# Patient Record
Sex: Female | Born: 1965 | Race: White | Hispanic: No | Marital: Single
Health system: Southern US, Community
[De-identification: ages and names within clinical notes are randomized; demographics above are authoritative.]

## PROBLEM LIST (undated history)

## (undated) DIAGNOSIS — Z460 Encounter for fitting and adjustment of spectacles and contact lenses: Secondary | ICD-10-CM

## (undated) DIAGNOSIS — F419 Anxiety disorder, unspecified: Secondary | ICD-10-CM

## (undated) DIAGNOSIS — F32A Depression, unspecified: Secondary | ICD-10-CM

## (undated) DIAGNOSIS — R112 Nausea with vomiting, unspecified: Secondary | ICD-10-CM

## (undated) DIAGNOSIS — M5430 Sciatica, unspecified side: Secondary | ICD-10-CM

## (undated) DIAGNOSIS — F329 Major depressive disorder, single episode, unspecified: Secondary | ICD-10-CM

## (undated) DIAGNOSIS — Z9889 Other specified postprocedural states: Secondary | ICD-10-CM

## (undated) DIAGNOSIS — I82409 Acute embolism and thrombosis of unspecified deep veins of unspecified lower extremity: Secondary | ICD-10-CM

## (undated) DIAGNOSIS — D489 Neoplasm of uncertain behavior, unspecified: Secondary | ICD-10-CM

## (undated) DIAGNOSIS — K219 Gastro-esophageal reflux disease without esophagitis: Secondary | ICD-10-CM

## (undated) HISTORY — PX: BACK SURGERY: SHX140

## (undated) HISTORY — DX: Gastro-esophageal reflux disease without esophagitis: K21.9

---

## 2000-05-14 ENCOUNTER — Other Ambulatory Visit: Admission: RE | Admit: 2000-05-14 | Discharge: 2000-05-14 | Payer: Self-pay | Admitting: Orthopedic Surgery

## 2000-09-05 ENCOUNTER — Encounter: Admission: RE | Admit: 2000-09-05 | Discharge: 2000-09-05 | Payer: Self-pay | Admitting: Orthopedic Surgery

## 2000-09-05 ENCOUNTER — Encounter: Payer: Self-pay | Admitting: Orthopedic Surgery

## 2000-09-23 ENCOUNTER — Encounter: Admission: RE | Admit: 2000-09-23 | Discharge: 2000-11-09 | Payer: Self-pay | Admitting: Anesthesiology

## 2000-10-29 ENCOUNTER — Inpatient Hospital Stay (HOSPITAL_COMMUNITY): Admission: RE | Admit: 2000-10-29 | Discharge: 2000-10-31 | Payer: Self-pay | Admitting: Orthopedic Surgery

## 2001-01-03 ENCOUNTER — Other Ambulatory Visit: Admission: RE | Admit: 2001-01-03 | Discharge: 2001-01-03 | Payer: Self-pay | Admitting: Obstetrics and Gynecology

## 2001-01-21 ENCOUNTER — Emergency Department (HOSPITAL_COMMUNITY): Admission: EM | Admit: 2001-01-21 | Discharge: 2001-01-21 | Payer: Self-pay | Admitting: Emergency Medicine

## 2001-01-24 ENCOUNTER — Ambulatory Visit (HOSPITAL_COMMUNITY): Admission: RE | Admit: 2001-01-24 | Discharge: 2001-01-24 | Payer: Self-pay | Admitting: Orthopedic Surgery

## 2002-02-25 ENCOUNTER — Other Ambulatory Visit: Admission: RE | Admit: 2002-02-25 | Discharge: 2002-02-25 | Payer: Self-pay | Admitting: Obstetrics and Gynecology

## 2005-05-17 ENCOUNTER — Emergency Department (HOSPITAL_COMMUNITY): Admission: EM | Admit: 2005-05-17 | Discharge: 2005-05-17 | Payer: Self-pay | Admitting: Family Medicine

## 2006-03-12 HISTORY — PX: CARPAL TUNNEL RELEASE: SHX101

## 2010-03-12 DIAGNOSIS — D489 Neoplasm of uncertain behavior, unspecified: Secondary | ICD-10-CM

## 2010-03-12 DIAGNOSIS — I82409 Acute embolism and thrombosis of unspecified deep veins of unspecified lower extremity: Secondary | ICD-10-CM

## 2010-03-12 HISTORY — DX: Neoplasm of uncertain behavior, unspecified: D48.9

## 2010-03-12 HISTORY — PX: OTHER SURGICAL HISTORY: SHX169

## 2010-03-12 HISTORY — DX: Acute embolism and thrombosis of unspecified deep veins of unspecified lower extremity: I82.409

## 2010-07-18 ENCOUNTER — Other Ambulatory Visit: Payer: Self-pay | Admitting: *Deleted

## 2010-07-18 DIAGNOSIS — M545 Low back pain: Secondary | ICD-10-CM

## 2010-07-21 ENCOUNTER — Ambulatory Visit
Admission: RE | Admit: 2010-07-21 | Discharge: 2010-07-21 | Disposition: A | Discharge status: Home / Self Care | Payer: PRIVATE HEALTH INSURANCE | Source: Ambulatory Visit | Attending: *Deleted | Admitting: *Deleted

## 2010-07-21 DIAGNOSIS — M545 Low back pain: Secondary | ICD-10-CM

## 2010-07-24 ENCOUNTER — Other Ambulatory Visit: Payer: Self-pay | Admitting: Chiropractic Medicine

## 2010-07-24 DIAGNOSIS — R52 Pain, unspecified: Secondary | ICD-10-CM

## 2011-03-16 ENCOUNTER — Encounter: Payer: Self-pay | Admitting: Family Medicine

## 2011-04-05 ENCOUNTER — Encounter: Payer: Self-pay | Admitting: Family Medicine

## 2011-06-20 ENCOUNTER — Ambulatory Visit: Payer: PRIVATE HEALTH INSURANCE | Admitting: Internal Medicine

## 2011-08-01 ENCOUNTER — Ambulatory Visit (INDEPENDENT_AMBULATORY_CARE_PROVIDER_SITE_OTHER): Payer: PRIVATE HEALTH INSURANCE | Admitting: Internal Medicine

## 2011-08-01 ENCOUNTER — Encounter: Payer: Self-pay | Admitting: Internal Medicine

## 2011-08-01 VITALS — BP 140/90 | HR 82 | Temp 98.2°F | Resp 16 | Wt 152.0 lb

## 2011-08-01 DIAGNOSIS — F341 Dysthymic disorder: Secondary | ICD-10-CM

## 2011-08-01 DIAGNOSIS — F418 Other specified anxiety disorders: Secondary | ICD-10-CM

## 2011-08-01 DIAGNOSIS — J309 Allergic rhinitis, unspecified: Secondary | ICD-10-CM

## 2011-08-01 MED ORDER — AZELASTINE-FLUTICASONE 137-50 MCG/ACT NA SUSP: 1.0000 | Freq: Two times a day (BID) | NASAL | Status: DC

## 2011-08-01 MED ORDER — CLONAZEPAM 1 MG PO TABS: 1.0000 mg | ORAL_TABLET | Freq: Two times a day (BID) | ORAL | Status: DC | PRN

## 2011-08-01 MED ORDER — SERTRALINE HCL 100 MG PO TABS: 100.0000 mg | ORAL_TABLET | Freq: Every day | ORAL | Status: AC

## 2011-08-01 NOTE — Assessment & Plan Note (Signed)
She will have psychotherapy with hospice, today I asked her to increase her zoloft dose and she will take klonopin as needed for anxiety and panic

## 2011-08-01 NOTE — Patient Instructions (Signed)
Allergic Rhinitis Allergic rhinitis is when the mucous membranes in the nose respond to allergens. Allergens are particles in the air that cause your body to have an allergic reaction. This causes you to release allergic antibodies. Through a chain of events, these eventually cause you to release histamine into the blood stream (hence the use of antihistamines). Although meant to be protective to the body, it is this release that causes your discomfort, such as frequent sneezing, congestion and an itchy runny nose.  CAUSES  The pollen allergens may come from grasses, trees, and weeds. This is seasonal allergic rhinitis, or "hay fever." Other allergens cause year-round allergic rhinitis (perennial allergic rhinitis) such as house dust mite allergen, pet dander and mold spores.  SYMPTOMS   Nasal stuffiness (congestion).   Runny, itchy nose with sneezing and tearing of the eyes.   There is often an itching of the mouth, eyes and ears.  It cannot be cured, but it can be controlled with medications. DIAGNOSIS  If you are unable to determine the offending allergen, skin or blood testing may find it. TREATMENT   Avoid the allergen.   Medications and allergy shots (immunotherapy) can help.   Hay fever may often be treated with antihistamines in pill or nasal spray forms. Antihistamines block the effects of histamine. There are over-the-counter medicines that may help with nasal congestion and swelling around the eyes. Check with your caregiver before taking or giving this medicine.  If the treatment above does not work, there are many new medications your caregiver can prescribe. Stronger medications may be used if initial measures are ineffective. Desensitizing injections can be used if medications and avoidance fails. Desensitization is when a patient is given ongoing shots until the body becomes less sensitive to the allergen. Make sure you follow up with your caregiver if problems continue. SEEK  MEDICAL CARE IF:   You develop fever (more than 100.5 F (38.1 C).   You develop a cough that does not stop easily (persistent).   You have shortness of breath.   You start wheezing.   Symptoms interfere with normal daily activities.  Document Released: 11/21/2000 Document Revised: 02/15/2011 Document Reviewed: 06/02/2008 [facility] Patient Information 2012 [place], [place].Depression, Adolescent and Adult Depression is a true and treatable medical condition. In general there are two kinds of depression:  Depression we all experience in some form. For example depression from the death of a loved one, financial distress or natural disasters will trigger or increase depression.   Clinical depression, on the other hand, appears without an apparent cause or reason. This depression is a disease. Depression may be caused by chemical imbalance in the body and brain or may come as a response to a physical illness. Alcohol and other drugs can cause depression.  DIAGNOSIS  The diagnosis of depression is usually based upon symptoms and medical history. TREATMENT  Treatments for depression fall into three categories. These are:  Drug therapy. There are many medicines that treat depression. Responses may vary and sometimes trial and error is necessary to determine the best medicines and dosage for a particular patient.   Psychotherapy, also called talking treatments, helps people resolve their problems by looking at them from a different point of view and by giving people insight into their own personal makeup. Traditional psychotherapy looks at a childhood source of a problem. Other psychotherapy will look at current conflicts and move toward solving those. If the cause of depression is drug use, counseling is available to help abstain.  In time the depression will usually improve. If there were underlying causes for the chemical use, they can be addressed.   ECT (electroconvulsive therapy) or shock  treatment is not as commonly used today. It is a very effective treatment for severe suicidal depression. During ECT electrical impulses are applied to the head. These impulses cause a generalized seizure. It can be effective but causes a loss of memory for recent events. Sometimes this loss of memory may include the last several months.  Treat all depression or suicide threats as serious. Obtain professional help. Do not wait to see if serious depression will get better over time without help. Seek help for yourself or those around you. In the [place] the number to the National Suicide Help Lines With 24 Hour Help Are: 1-800-SUICIDE 629-815-2638 Document Released: 02/24/2000 Document Revised: 02/15/2011 Document Reviewed: 10/15/2007 [facility] Patient Information 2012 [place], [place].

## 2011-08-01 NOTE — Assessment & Plan Note (Signed)
Start dymista and continue claritin-d

## 2011-08-01 NOTE — Progress Notes (Signed)
Subjective:    Patient ID: Molly Fitzgerald, female    DOB: 02-22-1966, 46 y.o.   MRN: [id number]  Anxiety Presents for initial visit. Onset was 1 to 6 months ago. The problem has been gradually worsening. Symptoms include excessive worry, nervous/anxious behavior and panic. Patient reports no chest pain, compulsions, confusion, decreased concentration, depressed mood, dizziness, dry mouth, feeling of choking, hyperventilation, impotence, insomnia, irritability, malaise, muscle tension, nausea, obsessions, palpitations, restlessness, shortness of breath or suicidal ideas. Symptoms occur most days. The most recent episode lasted 3 minutes. The severity of symptoms is moderate. The symptoms are aggravated by family issues. The quality of sleep is poor. Nighttime awakenings: occasional.   Risk factors: she is caring for a dying aunt. Her past medical history is significant for depression. Past treatments include SSRIs. The treatment provided moderate relief. Compliance with prior treatments has been good.      Review of Systems  Constitutional: Negative for fever, chills, diaphoresis, activity change, appetite change, irritability, fatigue and unexpected weight change.  HENT: Positive for congestion, rhinorrhea, sneezing and postnasal drip. Negative for hearing loss, ear pain, nosebleeds, sore throat, facial swelling, drooling, mouth sores, trouble swallowing, neck pain, neck stiffness, dental problem, voice change, sinus pressure, tinnitus and ear discharge.   Eyes: Negative.   Respiratory: Negative for apnea, cough, choking, chest tightness, shortness of breath, wheezing and stridor.   Cardiovascular: Negative for chest pain, palpitations and leg swelling.  Gastrointestinal: Negative for nausea, vomiting, abdominal pain, diarrhea and constipation.  Genitourinary: Negative.  Negative for impotence.  Musculoskeletal: Negative for myalgias, back pain, joint swelling, arthralgias and gait problem.    Skin: Negative for color change, pallor, rash and wound.  Neurological: Negative.  Negative for dizziness.  Hematological: Negative for adenopathy. Does not bruise/bleed easily.  Psychiatric/Behavioral: Positive for dysphoric mood. Negative for suicidal ideas, hallucinations, behavioral problems, confusion, sleep disturbance, self-injury, decreased concentration and agitation. The patient is nervous/anxious. The patient does not have insomnia and is not hyperactive.        Objective:   Physical Exam  Vitals reviewed. Constitutional: She is oriented to person, place, and time. She appears well-developed and well-nourished. No distress.  HENT:  Head: Normocephalic and atraumatic. No trismus in the jaw.  Right Ear: Hearing, tympanic membrane, external ear and ear canal normal.  Left Ear: Hearing, tympanic membrane, external ear and ear canal normal.  Nose: Mucosal edema and rhinorrhea present. No nose lacerations, sinus tenderness, nasal deformity, septal deviation or nasal septal hematoma. No epistaxis.  No foreign bodies. Right sinus exhibits no maxillary sinus tenderness and no frontal sinus tenderness. Left sinus exhibits no maxillary sinus tenderness and no frontal sinus tenderness.  Mouth/Throat: Oropharynx is clear and moist and mucous membranes are normal. Mucous membranes are not pale, not dry and not cyanotic. No uvula swelling. No oropharyngeal exudate, posterior oropharyngeal edema, posterior oropharyngeal erythema or tonsillar abscesses.  Eyes: Conjunctivae are normal. Right eye exhibits no discharge. Left eye exhibits no discharge. No scleral icterus.  Neck: Normal range of motion. Neck supple. No JVD present. No tracheal deviation present. No thyromegaly present.  Cardiovascular: Normal rate, regular rhythm, normal heart sounds and intact distal pulses.  Exam reveals no gallop and no friction rub.   No murmur heard. Pulmonary/Chest: Effort normal and breath sounds normal. No  stridor. No respiratory distress. She has no wheezes. She has no rales. She exhibits no tenderness.  Abdominal: Soft. Bowel sounds are normal. She exhibits no distension and no mass. There is no tenderness.  There is no rebound and no guarding.  Musculoskeletal: Normal range of motion. She exhibits no edema.  Lymphadenopathy:    She has no cervical adenopathy.  Neurological: She is oriented to person, place, and time.  Skin: Skin is warm and dry. No rash noted. She is not diaphoretic. No erythema. No pallor.  Psychiatric: Her behavior is normal. Judgment and thought content normal. Her mood appears not anxious. Her affect is not angry, not blunt, not labile and not inappropriate. Her speech is not rapid and/or pressured, not delayed, not tangential and not slurred. She is not agitated, not aggressive, is not hyperactive, not slowed, not withdrawn, not actively hallucinating and not combative. Thought content is not paranoid and not delusional. Cognition and memory are normal. Cognition and memory are not impaired. She does not express impulsivity or inappropriate judgment. She exhibits a depressed mood (sad and tearful). She expresses no homicidal and no suicidal ideation. She expresses no suicidal plans and no homicidal plans. She is communicative. She exhibits normal recent memory and normal remote memory. She is attentive.          Assessment & Plan:

## 2011-10-25 ENCOUNTER — Encounter: Payer: Self-pay | Admitting: Internal Medicine

## 2011-10-25 ENCOUNTER — Other Ambulatory Visit: Payer: Self-pay | Admitting: Internal Medicine

## 2011-10-25 DIAGNOSIS — F418 Other specified anxiety disorders: Secondary | ICD-10-CM

## 2011-10-25 MED ORDER — BUPROPION HCL ER (XL) 150 MG PO TB24: 150.0000 mg | ORAL_TABLET | Freq: Every day | ORAL | Status: DC

## 2011-10-25 NOTE — Progress Notes (Signed)
  Subjective:    Patient ID: Molly Fitzgerald, female    DOB: [birth year], 46 y.o.   MRN: [id number]  HPI    Review of Systems     Objective:   Physical Exam   See note from [facility] requesting that she I write an Rx for wellbutrin     Assessment & Plan:

## 2012-02-18 ENCOUNTER — Other Ambulatory Visit: Payer: Self-pay | Admitting: Internal Medicine

## 2012-03-12 HISTORY — PX: LUMBAR DISC SURGERY: SHX700

## 2012-08-05 ENCOUNTER — Encounter (HOSPITAL_COMMUNITY): Payer: Self-pay

## 2012-08-05 ENCOUNTER — Emergency Department (HOSPITAL_COMMUNITY): Payer: No Typology Code available for payment source

## 2012-08-05 ENCOUNTER — Emergency Department (HOSPITAL_COMMUNITY)
Admission: EM | Admit: 2012-08-05 | Discharge: 2012-08-05 | Disposition: A | Discharge status: Home / Self Care | Payer: No Typology Code available for payment source | Attending: Emergency Medicine | Admitting: Emergency Medicine

## 2012-08-05 DIAGNOSIS — M549 Dorsalgia, unspecified: Secondary | ICD-10-CM | POA: Insufficient documentation

## 2012-08-05 DIAGNOSIS — Z882 Allergy status to sulfonamides status: Secondary | ICD-10-CM | POA: Insufficient documentation

## 2012-08-05 DIAGNOSIS — G8929 Other chronic pain: Secondary | ICD-10-CM | POA: Insufficient documentation

## 2012-08-05 DIAGNOSIS — K219 Gastro-esophageal reflux disease without esophagitis: Secondary | ICD-10-CM | POA: Insufficient documentation

## 2012-08-05 DIAGNOSIS — Z79899 Other long term (current) drug therapy: Secondary | ICD-10-CM | POA: Insufficient documentation

## 2012-08-05 DIAGNOSIS — Z86718 Personal history of other venous thrombosis and embolism: Secondary | ICD-10-CM | POA: Insufficient documentation

## 2012-08-05 DIAGNOSIS — H53149 Visual discomfort, unspecified: Secondary | ICD-10-CM | POA: Insufficient documentation

## 2012-08-05 DIAGNOSIS — R112 Nausea with vomiting, unspecified: Secondary | ICD-10-CM | POA: Insufficient documentation

## 2012-08-05 DIAGNOSIS — Z91040 Latex allergy status: Secondary | ICD-10-CM | POA: Insufficient documentation

## 2012-08-05 DIAGNOSIS — R51 Headache: Secondary | ICD-10-CM | POA: Insufficient documentation

## 2012-08-05 HISTORY — DX: Acute embolism and thrombosis of unspecified deep veins of unspecified lower extremity: I82.409

## 2012-08-05 HISTORY — DX: Neoplasm of uncertain behavior, unspecified: D48.9

## 2012-08-05 MED ORDER — KETOROLAC TROMETHAMINE 30 MG/ML IJ SOLN
15.0000 mg | Freq: Once | INTRAMUSCULAR | Status: AC
  Administered 2012-08-05: 15 mg via INTRAVENOUS

## 2012-08-05 MED ORDER — ONDANSETRON HCL 4 MG/2ML IJ SOLN
4.0000 mg | Freq: Once | INTRAMUSCULAR | Status: AC
  Administered 2012-08-05: 4 mg via INTRAVENOUS
  Filled 2012-08-05: qty 2

## 2012-08-05 MED ORDER — HYDROMORPHONE HCL PF 1 MG/ML IJ SOLN
1.0000 mg | Freq: Once | INTRAMUSCULAR | Status: AC
  Administered 2012-08-05: 1 mg via INTRAVENOUS
  Filled 2012-08-05: qty 1

## 2012-08-05 MED ORDER — MORPHINE SULFATE 4 MG/ML IJ SOLN
4.0000 mg | Freq: Once | INTRAMUSCULAR | Status: AC
  Administered 2012-08-05: 4 mg via INTRAVENOUS
  Filled 2012-08-05: qty 1

## 2012-08-05 MED ORDER — VALPROATE SODIUM 500 MG/5ML IV SOLN
500.0000 mg | Freq: Once | INTRAVENOUS | Status: AC
  Administered 2012-08-05: 500 mg via INTRAVENOUS
  Filled 2012-08-05: qty 5

## 2012-08-05 MED ORDER — SODIUM CHLORIDE 0.9 % IV BOLUS (SEPSIS)
1000.0000 mL | Freq: Once | INTRAVENOUS | Status: AC
  Administered 2012-08-05: 1000 mL via INTRAVENOUS

## 2012-08-05 MED ORDER — KETOROLAC TROMETHAMINE 30 MG/ML IJ SOLN
INTRAMUSCULAR | Status: AC
  Filled 2012-08-05: qty 1

## 2012-08-05 MED ORDER — DEXAMETHASONE SODIUM PHOSPHATE 10 MG/ML IJ SOLN
10.0000 mg | Freq: Once | INTRAMUSCULAR | Status: AC
  Administered 2012-08-05: 10 mg via INTRAVENOUS
  Filled 2012-08-05: qty 1

## 2012-08-05 MED ORDER — PROCHLORPERAZINE EDISYLATE 5 MG/ML IJ SOLN
10.0000 mg | Freq: Once | INTRAMUSCULAR | Status: AC
  Administered 2012-08-05: 10 mg via INTRAVENOUS
  Filled 2012-08-05: qty 2

## 2012-08-05 MED ORDER — KETOROLAC TROMETHAMINE 15 MG/ML IJ SOLN
15.0000 mg | Freq: Once | INTRAMUSCULAR | Status: DC
  Filled 2012-08-05: qty 1

## 2012-08-05 MED ORDER — DIPHENHYDRAMINE HCL 50 MG/ML IJ SOLN
25.0000 mg | Freq: Once | INTRAMUSCULAR | Status: AC
  Administered 2012-08-05: 25 mg via INTRAVENOUS
  Filled 2012-08-05: qty 1

## 2012-08-05 NOTE — ED Provider Notes (Signed)
I saw and evaluated the patient, reviewed the resident's note and I agree with the findings and plan. The patient presents here with sudden onset of headache while having an epidural steroid injection performed.  She denies and fever, neck pain, or visual changes.    On exam, the patient is afebrile the vitals are stable.  The heart and lung exam is unremarkable.  The cranial nerves are intact and strength is 5/5 in the BUE and BLE.  There is no papilledema on fundoscopic exam.    She was given meds but is not feeling much better.  She will be given additional medications for her headache and see how she responds.    Geoffery Lyons, MD 08/05/12 1714

## 2012-08-05 NOTE — ED Provider Notes (Signed)
History     CSN: [id number]  Arrival date & time 08/05/12  1306   First MD Initiated Contact with Patient 08/05/12 1306      Chief Complaint  Patient presents with  . Headache  . Emesis  . s/p nerve block to L5-S26     HPI 47 year old female, previously healthy, presents complaining of severe headache. She was at a spine specialist having a spinal nerve block today when she developed sudden onset severe headache during the procedure. She states that the pain was about a 6/10 in severity at onset. It has worsened over the course of the past hour since onset. Is now 7/10. Was associated with 3-4 episodes of emesis. She has nausea and phonophobia. She states that prior to the procedure she felt fine except for her chronic back pain. She denied any headache, neck pain, fever, chills, or any other significant symptoms prior to the onset of this headache. She states that she gets occasional headaches, usually associated with sinusitis or allergies, but has never been diagnosed with migraines and does not get frequent headaches. States this is the worst headache she's had.  She denies any visual changes, difficulty with speech, weakness in her arms or legs, paresthesias, numbness, difficulty with walking or balance.   Past Medical History  Diagnosis Date  . GERD (gastroesophageal reflux disease)   . DVT (deep venous thrombosis) 2012    left leg after surgery   . 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 Tumor removed Left 2012    from left knee  . Carpal tunnel release Left     Family History  Problem Relation Age of Onset  . Arthritis Mother   . Hypertension Mother   . Hypertension Father   . Stroke Neg Hx   . Hyperlipidemia Neg Hx   . Heart disease Neg Hx   . Diabetes Neg Hx   . Alcohol abuse Neg Hx   . Cancer Neg Hx   . COPD Neg Hx     History  Substance Use Topics  . Smoking status: Never Smoker   .  Smokeless tobacco: Never Used  . Alcohol Use: 1.8 oz/week    3 Cans of beer per week    OB History   Grav Para Term Preterm Abortions TAB SAB Ect Mult Living                  Review of Systems  Constitutional: Negative for fever, chills and diaphoresis.  HENT: Negative for congestion, rhinorrhea, neck pain and neck stiffness.   Respiratory: Negative for cough, shortness of breath and wheezing.   Cardiovascular: Negative for chest pain and leg swelling.  Gastrointestinal: Positive for nausea and vomiting. Negative for abdominal pain and diarrhea.  Genitourinary: Negative for dysuria, urgency, frequency, flank pain, vaginal bleeding, vaginal discharge and difficulty urinating.  Musculoskeletal: Positive for back pain.  Skin: Negative for rash.  Neurological: Positive for headaches. Negative for weakness and numbness.  All other systems reviewed and are negative.    Allergies  Other; Latex; and Sulfa antibiotics  Home Medications   Current Outpatient Rx  Name  Route  Sig  Dispense  Refill  . ALPRAZolam (XANAX) 1 MG tablet   Oral   Take 1 mg by mouth 3 (three) times daily as needed for anxiety.         Marland Kitchen loratadine-pseudoephedrine (CLARITIN-D 12-HOUR) 5-120 MG  per tablet   Oral   Take 1 tablet by mouth daily as needed for allergies.          . Phenylephrine-DM-GG-APAP (MUCINEX CHILD MULTI-SYMPTOM PO)   Oral   Take 1 tablet by mouth daily as needed (for allergies).           BP 161/102  Temp(Src) 98.3 F (36.8 C) (Oral)  Resp 22  SpO2 97%  LMP 07/13/2012  Physical Exam  Nursing note and vitals reviewed. Constitutional: She is oriented to person, place, and time. She appears well-developed and well-nourished. No distress.  HENT:  Head: Normocephalic and atraumatic.  Mouth/Throat: Oropharynx is clear and moist.  Eyes: Conjunctivae and EOM are normal. Pupils are equal, round, and reactive to light. No scleral icterus.  Neck: Normal range of motion. Neck  supple. No JVD present.  No meningismus. Negative Kernig and Brudzinski.  Cardiovascular: Normal rate, regular rhythm, normal heart sounds and intact distal pulses.  Exam reveals no gallop and no friction rub.   No murmur heard. Pulmonary/Chest: Effort normal and breath sounds normal. No respiratory distress. She has no wheezes. She has no rales.  Abdominal: Soft. Bowel sounds are normal. She exhibits no distension. There is no tenderness. There is no rebound and no guarding.  Musculoskeletal: She exhibits no edema.  Neurological: She is alert and oriented to person, place, and time. No cranial nerve deficit. She exhibits normal muscle tone. Coordination normal.  Cranial nerves II through XII intact. Pupils equal round and reactive to light. Extraocular movements intact. No nystagmus. 5 out of 5 strength in bilateral upper and lower extremities. Normal sensation to light touch throughout. Visual fields intact to confrontation. Normal gait.  Skin: Skin is warm and dry. She is not diaphoretic.    ED Course  Procedures (including critical care time)  Labs Reviewed - No data to display Ct Head Wo Contrast  08/05/2012   *RADIOLOGY REPORT*  Clinical Data: Headache, emesis  CT HEAD WITHOUT CONTRAST  Technique:  Contiguous axial images were obtained from the base of the skull through the vertex without contrast.  Comparison: None.  Findings: Mild brain atrophy with prominence of the anterior frontal CSF spaces.  No acute intracranial hemorrhage, mass lesion, infarction, midline shift, herniation, or hydrocephalus.  Gray- white matter differentiation maintained.  Cisterns patent.  No cerebellar abnormality.  Gray-white matter differentiation maintained.  Mastoids and sinuses clear.  No skull abnormality. Symmetric orbits.  IMPRESSION: No acute intracranial finding.  Mild atrophy.   Original Report Authenticated By: Judie Petit. Miles Costain, M.D.     No diagnosis found.    MDM  47 year old female presents with  sudden onset severe headache that came on while she was getting a spinal nerve block today. She has no other neurologic complaints. She is hypertensive to 161/102, otherwise her vitals are within normal limits. She appears uncomfortable. She has a nonfocal neurological exam.  Head CT demonstrates no acute intracranial abnormality.  Treated with a migraine cocktail. On reevaluation, she's had essentially no improvement in her symptoms. Will add IV Depakote and Dilaudid.  Due to the fact that she has a nonfocal neurological exam, negative head CT, and given the sensitivity of CT within the first 6 hours of subarachnoid hemorrhage, I doubt subarachnoid hemorrhage as the cause of her headache. I doubt CNS infection, cerebral venous thrombosis, or other emergent cause of headache. Likely a migraine brought on by the stress of the procedure she was undergoing. We'll continue efforts at symptom control with plan to  discharge her with PCP followup if we can control her symptoms. Signed out to Dr. Eula Listen and he will followup and reevaluate patient in about an hour. If her symptoms are related to that time neurology consultation ordination for symptom control may be warranted.        Toney Sang, MD 08/05/12 1642

## 2012-08-05 NOTE — ED Notes (Signed)
Per [facility], pt was seen [facility] today to have nerve block completed on L5-S1. Procedure was completed and pt began having a HA and vomiting. Was given zofran 4 mg IM prior to receiving an IV by EMS. NSR on the monitor.

## 2012-10-09 ENCOUNTER — Emergency Department (INDEPENDENT_AMBULATORY_CARE_PROVIDER_SITE_OTHER): Payer: No Typology Code available for payment source

## 2012-10-09 ENCOUNTER — Encounter (HOSPITAL_COMMUNITY): Payer: Self-pay | Admitting: Emergency Medicine

## 2012-10-09 ENCOUNTER — Emergency Department (INDEPENDENT_AMBULATORY_CARE_PROVIDER_SITE_OTHER)
Admission: EM | Admit: 2012-10-09 | Discharge: 2012-10-09 | Disposition: A | Discharge status: Home / Self Care | Payer: No Typology Code available for payment source | Source: Home / Self Care | Attending: Family Medicine | Admitting: Family Medicine

## 2012-10-09 DIAGNOSIS — S8263XA Displaced fracture of lateral malleolus of unspecified fibula, initial encounter for closed fracture: Secondary | ICD-10-CM

## 2012-10-09 DIAGNOSIS — W19XXXA Unspecified fall, initial encounter: Secondary | ICD-10-CM

## 2012-10-09 DIAGNOSIS — X58XXXA Exposure to other specified factors, initial encounter: Secondary | ICD-10-CM

## 2012-10-09 HISTORY — DX: Sciatica, unspecified side: M54.30

## 2012-10-09 MED ORDER — TETANUS-DIPHTH-ACELL PERTUSSIS 5-2.5-18.5 LF-MCG/0.5 IM SUSP
INTRAMUSCULAR | Status: AC
  Filled 2012-10-09: qty 0.5

## 2012-10-09 MED ORDER — TETANUS-DIPHTH-ACELL PERTUSSIS 5-2.5-18.5 LF-MCG/0.5 IM SUSP
0.5000 mL | Freq: Once | INTRAMUSCULAR | Status: AC
  Administered 2012-10-09: 0.5 mL via INTRAMUSCULAR

## 2012-10-09 MED ORDER — TETANUS-DIPHTHERIA TOXOIDS TD 5-2 LFU IM INJ
INJECTION | INTRAMUSCULAR | Status: AC
  Filled 2012-10-09: qty 0.5

## 2012-10-09 NOTE — ED Provider Notes (Signed)
CSN: [id number]     Arrival date & time 10/09/12  1806 History     First MD Initiated Contact with Patient 10/09/12 1854     Chief Complaint  Patient presents with  . Ankle Pain   (Consider location/radiation/quality/duration/timing/severity/associated sxs/prior Treatment) Patient is a 47 y.o. female presenting with ankle pain.  Ankle Pain Associated symptoms: no fever     Walking dog today. Dog pulled over. Rolled down hill and ankle eventually struck cement. Struck on lateral aspect of foot. Felt a pop. Instant swelling, continues to increase. Now w/ diminished sensation in toes (notes some sensation loss from recent discectomy of L4-5 S1). No previous trama to affected foot. Able to bear weight. Unable to ambulate.   Past Medical History  Diagnosis Date  . GERD (gastroesophageal reflux disease)   . DVT (deep venous thrombosis) 2012    left leg after surgery   . 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 Sciatica    Past Surgical History  Procedure Laterality Date  . Tumor removed Left 2012    from left knee  . Carpal tunnel release Left   . Back surgery    . Lumbar disc surgery  2014    L4-5 S1   Family History  Problem Relation Age of Onset  . Arthritis Mother   . Hypertension Mother   . Hypertension Father   . Stroke Neg Hx   . Hyperlipidemia Neg Hx   . Heart disease Neg Hx   . Diabetes Neg Hx   . Alcohol abuse Neg Hx   . Cancer Neg Hx   . COPD Neg Hx    History  Substance Use Topics  . Smoking status: Never Smoker   . Smokeless tobacco: Never Used  . Alcohol Use: 1.8 oz/week    3 Cans of beer per week   OB History   Grav Para Term Preterm Abortions TAB SAB Ect Mult Living                 Review of Systems  Constitutional: Negative for fever.  Respiratory: Negative for shortness of breath.   Cardiovascular: Negative for chest pain.  Gastrointestinal: Negative for abdominal pain, diarrhea and constipation.  Musculoskeletal: Positive  for joint swelling.  All other systems reviewed and are negative.    Allergies  Other; Latex; and Sulfa antibiotics  Home Medications   Current Outpatient Rx  Name  Route  Sig  Dispense  Refill  . Metaxalone (SKELAXIN PO)   Oral   Take by mouth.         . Methocarbamol (ROBAXIN PO)   Oral   Take by mouth.         . ALPRAZolam (XANAX) 1 MG tablet   Oral   Take 1 mg by mouth 3 (three) times daily as needed for anxiety.         Marland Kitchen loratadine-pseudoephedrine (CLARITIN-D 12-HOUR) 5-120 MG per tablet   Oral   Take 1 tablet by mouth daily as needed for allergies.          . Phenylephrine-DM-GG-APAP (MUCINEX CHILD MULTI-SYMPTOM PO)   Oral   Take 1 tablet by mouth daily as needed (for allergies).          BP 121/84  Pulse 95  Temp(Src) 99.2 F (37.3 C) (Oral)  Resp 16  SpO2 100%  LMP 08/13/2012 Physical Exam  Constitutional: She is oriented to person, place, and time. She appears well-developed.  No distress.  HENT:  Head: Normocephalic and atraumatic.  Eyes: EOM are normal. Pupils are equal, round, and reactive to light.  Neck: Normal range of motion.  Cardiovascular: Normal rate.   Pulmonary/Chest: Effort normal.  Abdominal: Soft. She exhibits no distension.  Musculoskeletal: She exhibits edema and tenderness.  Severe tenderness over the lateral aspect of the R ankle w/ limited ROM secondary to pain. Skin discoloration over swollen area,. Distal pulses and sensation intact.   Neurological: She is alert and oriented to person, place, and time.  Skin: Skin is warm and dry. She is not diaphoretic.  Skin abrasions x2 on R knee.   Psychiatric: She has a normal mood and affect. Her behavior is normal.    ED Course   Procedures (including critical care time)  Labs Reviewed - No data to display Dg Ankle Complete Right  10/09/2012   *RADIOLOGY REPORT*  Clinical Data: Posterolateral right ankle pain and swelling with bruising following an injury today.  RIGHT  ANKLE - COMPLETE 3+ VIEW  Comparison: None.  Findings: Oblique fracture through the proximal lateral malleolus with mild posterolateral displacement of the distal fragment.  No significant angulation.  Associated soft tissue swelling.  IMPRESSION: Lateral malleolus fracture, as described above.   Original Report Authenticated By: Beckie Salts, M.D.   1. Fall, initial encounter   2. Fracture in accidental fall, initial encounter     MDM  47yo female s/p mechanical fall w/ fracture of the lateral malleolus. Vasculature and nervous system intact - Cam walker boot - crutches - Tetanus given - deferring pain medicatinos at thist time - recommended rest, elevation, Ice. - Ortho consult sent.  Shelly Flatten, MD Family Medicine PGY-3 10/09/2012, 8:30 PM      Ozella Rocks, MD 10/09/12 2107

## 2012-10-09 NOTE — ED Notes (Signed)
Reports walking her dog today, dog abruptly changed direction, and patient fell.  Patient landed on concrete, abrasion to right knee, pain in ankle, swelling in ankle and majority of pain in lateral ankle.  Pulses 2 plus. Patient reports baseline numbness in this foot, however right great to numbness is new.

## 2012-10-09 NOTE — ED Notes (Signed)
Elevated foot.

## 2012-10-10 NOTE — ED Provider Notes (Signed)
Medical screening examination/treatment/procedure(s) were performed by resident physician or non-physician practitioner and as supervising physician I was immediately available for consultation/collaboration.   Barkley Bruns MD.   Linna Hoff, MD 10/10/12 [phone]

## 2012-10-13 ENCOUNTER — Encounter (HOSPITAL_BASED_OUTPATIENT_CLINIC_OR_DEPARTMENT_OTHER): Payer: Self-pay | Admitting: *Deleted

## 2012-10-13 NOTE — Progress Notes (Signed)
No labs needed-staying with parents Was a Designer, fashion/clothing

## 2012-10-15 ENCOUNTER — Other Ambulatory Visit: Payer: Self-pay | Admitting: Orthopedic Surgery

## 2012-10-16 ENCOUNTER — Ambulatory Visit (HOSPITAL_BASED_OUTPATIENT_CLINIC_OR_DEPARTMENT_OTHER)
Admission: RE | Admit: 2012-10-16 | Discharge: 2012-10-16 | Disposition: A | Discharge status: Home / Self Care | Payer: No Typology Code available for payment source | Source: Ambulatory Visit | Attending: Orthopedic Surgery | Admitting: Orthopedic Surgery

## 2012-10-16 ENCOUNTER — Encounter (HOSPITAL_BASED_OUTPATIENT_CLINIC_OR_DEPARTMENT_OTHER): Payer: Self-pay | Admitting: Certified Registered Nurse Anesthetist

## 2012-10-16 ENCOUNTER — Encounter (HOSPITAL_BASED_OUTPATIENT_CLINIC_OR_DEPARTMENT_OTHER)
Admission: RE | Disposition: A | Discharge status: Home / Self Care | Payer: Self-pay | Source: Ambulatory Visit | Attending: Orthopedic Surgery

## 2012-10-16 ENCOUNTER — Ambulatory Visit (HOSPITAL_BASED_OUTPATIENT_CLINIC_OR_DEPARTMENT_OTHER): Payer: No Typology Code available for payment source | Admitting: Certified Registered Nurse Anesthetist

## 2012-10-16 ENCOUNTER — Ambulatory Visit (HOSPITAL_COMMUNITY): Payer: No Typology Code available for payment source

## 2012-10-16 DIAGNOSIS — Z888 Allergy status to other drugs, medicaments and biological substances status: Secondary | ICD-10-CM | POA: Insufficient documentation

## 2012-10-16 DIAGNOSIS — W19XXXA Unspecified fall, initial encounter: Secondary | ICD-10-CM | POA: Insufficient documentation

## 2012-10-16 DIAGNOSIS — F411 Generalized anxiety disorder: Secondary | ICD-10-CM | POA: Insufficient documentation

## 2012-10-16 DIAGNOSIS — K219 Gastro-esophageal reflux disease without esophagitis: Secondary | ICD-10-CM | POA: Insufficient documentation

## 2012-10-16 DIAGNOSIS — S8261XD Displaced fracture of lateral malleolus of right fibula, subsequent encounter for closed fracture with routine healing: Secondary | ICD-10-CM

## 2012-10-16 DIAGNOSIS — Z79899 Other long term (current) drug therapy: Secondary | ICD-10-CM | POA: Insufficient documentation

## 2012-10-16 DIAGNOSIS — F3289 Other specified depressive episodes: Secondary | ICD-10-CM | POA: Insufficient documentation

## 2012-10-16 DIAGNOSIS — Z91040 Latex allergy status: Secondary | ICD-10-CM | POA: Insufficient documentation

## 2012-10-16 DIAGNOSIS — Z882 Allergy status to sulfonamides status: Secondary | ICD-10-CM | POA: Insufficient documentation

## 2012-10-16 DIAGNOSIS — Y929 Unspecified place or not applicable: Secondary | ICD-10-CM | POA: Insufficient documentation

## 2012-10-16 DIAGNOSIS — M5430 Sciatica, unspecified side: Secondary | ICD-10-CM | POA: Insufficient documentation

## 2012-10-16 DIAGNOSIS — Z91018 Allergy to other foods: Secondary | ICD-10-CM | POA: Insufficient documentation

## 2012-10-16 DIAGNOSIS — F329 Major depressive disorder, single episode, unspecified: Secondary | ICD-10-CM | POA: Insufficient documentation

## 2012-10-16 DIAGNOSIS — S8263XA Displaced fracture of lateral malleolus of unspecified fibula, initial encounter for closed fracture: Secondary | ICD-10-CM | POA: Insufficient documentation

## 2012-10-16 DIAGNOSIS — G709 Myoneural disorder, unspecified: Secondary | ICD-10-CM | POA: Insufficient documentation

## 2012-10-16 DIAGNOSIS — Z86718 Personal history of other venous thrombosis and embolism: Secondary | ICD-10-CM | POA: Insufficient documentation

## 2012-10-16 HISTORY — DX: Other specified postprocedural states: Z98.890

## 2012-10-16 HISTORY — DX: Nausea with vomiting, unspecified: R11.2

## 2012-10-16 HISTORY — DX: Encounter for fitting and adjustment of spectacles and contact lenses: Z46.0

## 2012-10-16 HISTORY — DX: Depression, unspecified: F32.A

## 2012-10-16 HISTORY — DX: Major depressive disorder, single episode, unspecified: F32.9

## 2012-10-16 HISTORY — DX: Anxiety disorder, unspecified: F41.9

## 2012-10-16 HISTORY — PX: ORIF ANKLE FRACTURE: SHX5408

## 2012-10-16 LAB — POCT HEMOGLOBIN-HEMACUE: Hemoglobin: 12 g/dL (ref 12.0–15.0)

## 2012-10-16 SURGERY — OPEN REDUCTION INTERNAL FIXATION (ORIF) ANKLE FRACTURE
Anesthesia: Regional | Site: Ankle | Laterality: Right | Wound class: Clean

## 2012-10-16 MED ORDER — ONDANSETRON HCL 4 MG/2ML IJ SOLN
INTRAMUSCULAR | Status: DC | PRN
  Administered 2012-10-16: 4 mg via INTRAVENOUS
  Administered 2012-10-16: 10 mg via INTRAVENOUS

## 2012-10-16 MED ORDER — BUPIVACAINE-EPINEPHRINE 0.5% -1:200000 IJ SOLN
INTRAMUSCULAR | Status: DC | PRN
  Administered 2012-10-16: 18 mL

## 2012-10-16 MED ORDER — CEFAZOLIN SODIUM-DEXTROSE 2-3 GM-% IV SOLR
2.0000 g | INTRAVENOUS | Status: AC
  Administered 2012-10-16: 2 g via INTRAVENOUS

## 2012-10-16 MED ORDER — PROPOFOL 10 MG/ML IV BOLUS
INTRAVENOUS | Status: DC | PRN
  Administered 2012-10-16: 50 mg via INTRAVENOUS
  Administered 2012-10-16: 250 mg via INTRAVENOUS

## 2012-10-16 MED ORDER — FENTANYL CITRATE 0.05 MG/ML IJ SOLN
INTRAMUSCULAR | Status: DC | PRN
  Administered 2012-10-16: 100 ug via INTRAVENOUS

## 2012-10-16 MED ORDER — LIDOCAINE HCL (CARDIAC) 20 MG/ML IV SOLN
INTRAVENOUS | Status: DC | PRN
  Administered 2012-10-16: 30 mg via INTRAVENOUS

## 2012-10-16 MED ORDER — ASPIRIN EC 325 MG PO TBEC: 325.0000 mg | DELAYED_RELEASE_TABLET | Freq: Every day | ORAL | Status: DC

## 2012-10-16 MED ORDER — 0.9 % SODIUM CHLORIDE (POUR BTL) OPTIME
TOPICAL | Status: DC | PRN
  Administered 2012-10-16: 300 mL

## 2012-10-16 MED ORDER — OXYCODONE HCL 5 MG PO TABS
5.0000 mg | ORAL_TABLET | Freq: Once | ORAL | Status: AC | PRN
  Administered 2012-10-16: 5 mg via ORAL

## 2012-10-16 MED ORDER — FENTANYL CITRATE 0.05 MG/ML IJ SOLN
50.0000 ug | INTRAMUSCULAR | Status: DC | PRN
  Administered 2012-10-16: 100 ug via INTRAVENOUS

## 2012-10-16 MED ORDER — KETOROLAC TROMETHAMINE 30 MG/ML IJ SOLN
30.0000 mg | Freq: Once | INTRAMUSCULAR | Status: AC | PRN
  Administered 2012-10-16: 30 mg via INTRAVENOUS

## 2012-10-16 MED ORDER — DEXAMETHASONE SODIUM PHOSPHATE 4 MG/ML IJ SOLN
INTRAMUSCULAR | Status: DC | PRN
  Administered 2012-10-16: 4 mg

## 2012-10-16 MED ORDER — OXYCODONE HCL 5 MG/5ML PO SOLN: 5.0000 mg | Freq: Once | ORAL | Status: AC | PRN

## 2012-10-16 MED ORDER — BUPIVACAINE-EPINEPHRINE PF 0.5-1:200000 % IJ SOLN
INTRAMUSCULAR | Status: DC | PRN
  Administered 2012-10-16: 30 mL

## 2012-10-16 MED ORDER — MIDAZOLAM HCL 5 MG/5ML IJ SOLN
INTRAMUSCULAR | Status: DC | PRN
  Administered 2012-10-16: 1 mg via INTRAVENOUS

## 2012-10-16 MED ORDER — SCOPOLAMINE 1 MG/3DAYS TD PT72
1.0000 | MEDICATED_PATCH | TRANSDERMAL | Status: DC
  Administered 2012-10-16: 1.5 mg via TRANSDERMAL

## 2012-10-16 MED ORDER — LACTATED RINGERS IV SOLN
INTRAVENOUS | Status: DC
  Administered 2012-10-16: 08:00:00 via INTRAVENOUS
  Administered 2012-10-16: 10 mL/h via INTRAVENOUS
  Administered 2012-10-16: 10:00:00 via INTRAVENOUS

## 2012-10-16 MED ORDER — HYDROMORPHONE HCL PF 1 MG/ML IJ SOLN
0.2500 mg | INTRAMUSCULAR | Status: DC | PRN
  Administered 2012-10-16 (×4): 0.5 mg via INTRAVENOUS

## 2012-10-16 MED ORDER — CHLORHEXIDINE GLUCONATE 4 % EX LIQD: 60.0000 mL | Freq: Once | CUTANEOUS | Status: DC

## 2012-10-16 MED ORDER — OXYCODONE HCL 5 MG PO TABS: 5.0000 mg | ORAL_TABLET | ORAL | Status: DC | PRN

## 2012-10-16 MED ORDER — MIDAZOLAM HCL 2 MG/2ML IJ SOLN
1.0000 mg | INTRAMUSCULAR | Status: DC | PRN
  Administered 2012-10-16: 2 mg via INTRAVENOUS

## 2012-10-16 MED ORDER — BACITRACIN ZINC 500 UNIT/GM EX OINT
TOPICAL_OINTMENT | CUTANEOUS | Status: DC | PRN
  Administered 2012-10-16: 1 via TOPICAL

## 2012-10-16 MED ORDER — ONDANSETRON HCL 4 MG/2ML IJ SOLN: 4.0000 mg | Freq: Once | INTRAMUSCULAR | Status: DC | PRN

## 2012-10-16 SURGICAL SUPPLY — 76 items
BANDAGE ESMARK 6X9 LF (GAUZE/BANDAGES/DRESSINGS) ×1 IMPLANT
BIT DRILL 2.5X2.75 QC CALB (BIT) ×1 IMPLANT
BIT DRILL 3.5X5.5 QC CALB (BIT) ×1 IMPLANT
BLADE SURG 15 STRL LF DISP TIS (BLADE) ×2 IMPLANT
BLADE SURG 15 STRL SS (BLADE) ×4
BNDG CMPR 9X4 STRL LF SNTH (GAUZE/BANDAGES/DRESSINGS)
BNDG CMPR 9X6 STRL LF SNTH (GAUZE/BANDAGES/DRESSINGS) ×1
BNDG COHESIVE 4X5 TAN STRL (GAUZE/BANDAGES/DRESSINGS) ×2 IMPLANT
BNDG COHESIVE 6X5 TAN STRL LF (GAUZE/BANDAGES/DRESSINGS) ×2 IMPLANT
BNDG ESMARK 4X9 LF (GAUZE/BANDAGES/DRESSINGS) IMPLANT
BNDG ESMARK 6X9 LF (GAUZE/BANDAGES/DRESSINGS) ×2
CANISTER SUCTION 1200CC (MISCELLANEOUS) ×2 IMPLANT
CHLORAPREP W/TINT 26ML (MISCELLANEOUS) ×2 IMPLANT
CLOTH BEACON ORANGE TIMEOUT ST (SAFETY) ×2 IMPLANT
COVER TABLE BACK 60X90 (DRAPES) ×3 IMPLANT
CUFF TOURNIQUET SINGLE 34IN LL (TOURNIQUET CUFF) ×2 IMPLANT
DECANTER SPIKE VIAL GLASS SM (MISCELLANEOUS) IMPLANT
DRAPE C-ARM 42X72 X-RAY (DRAPES) ×2 IMPLANT
DRAPE C-ARMOR (DRAPES) ×1 IMPLANT
DRAPE EXTREMITY T 121X128X90 (DRAPE) ×2 IMPLANT
DRAPE INCISE IOBAN 66X45 STRL (DRAPES) IMPLANT
DRAPE U-SHAPE 47X51 STRL (DRAPES) ×2 IMPLANT
DRAPE U-SHAPE 76X120 STRL (DRAPES) IMPLANT
DRSG ADAPTIC 3X8 NADH LF (GAUZE/BANDAGES/DRESSINGS) IMPLANT
DRSG EMULSION OIL 3X3 NADH (GAUZE/BANDAGES/DRESSINGS) ×2 IMPLANT
DRSG PAD ABDOMINAL 8X10 ST (GAUZE/BANDAGES/DRESSINGS) ×4 IMPLANT
ELECT REM PT RETURN 9FT ADLT (ELECTROSURGICAL) ×2
ELECTRODE REM PT RTRN 9FT ADLT (ELECTROSURGICAL) ×1 IMPLANT
GLOVE BIO SURGEON STRL SZ8 (GLOVE) ×2 IMPLANT
GLOVE BIOGEL PI IND STRL 8 (GLOVE) ×1 IMPLANT
GLOVE BIOGEL PI INDICATOR 8 (GLOVE) ×1
GLOVE EXAM NITRILE MD LF STRL (GLOVE) ×1 IMPLANT
GLOVE SURG SS PI 6.5 STRL IVOR (GLOVE) ×1 IMPLANT
GLOVE SURG SS PI 8.0 STRL IVOR (GLOVE) ×1 IMPLANT
GOWN PREVENTION PLUS XLARGE (GOWN DISPOSABLE) ×2 IMPLANT
GOWN PREVENTION PLUS XXLARGE (GOWN DISPOSABLE) ×2 IMPLANT
NEEDLE HYPO 22GX1.5 SAFETY (NEEDLE) ×1 IMPLANT
NS IRRIG 1000ML POUR BTL (IV SOLUTION) ×2 IMPLANT
PACK BASIN DAY SURGERY FS (CUSTOM PROCEDURE TRAY) ×2 IMPLANT
PAD CAST 4YDX4 CTTN HI CHSV (CAST SUPPLIES) ×1 IMPLANT
PADDING CAST ABS 4INX4YD NS (CAST SUPPLIES)
PADDING CAST ABS COTTON 4X4 ST (CAST SUPPLIES) IMPLANT
PADDING CAST COTTON 4X4 STRL (CAST SUPPLIES) ×4
PADDING CAST COTTON 6X4 STRL (CAST SUPPLIES) ×2 IMPLANT
PENCIL BUTTON HOLSTER BLD 10FT (ELECTRODE) ×2 IMPLANT
PLATE ACE 100DEG 6HOLE (Plate) ×1 IMPLANT
SANITIZER HAND PURELL 535ML FO (MISCELLANEOUS) ×2 IMPLANT
SCREW CORTICAL 3.5MM  12MM (Screw) ×2 IMPLANT
SCREW CORTICAL 3.5MM  16MM (Screw) ×2 IMPLANT
SCREW CORTICAL 3.5MM 12MM (Screw) IMPLANT
SCREW CORTICAL 3.5MM 16MM (Screw) IMPLANT
SCREW CORTICAL 3.5MM 18MM (Screw) ×2 IMPLANT
SCREW CORTICAL 3.5MM 24MM (Screw) ×1 IMPLANT
SHEET MEDIUM DRAPE 40X70 STRL (DRAPES) ×2 IMPLANT
SLEEVE SCD COMPRESS KNEE MED (MISCELLANEOUS) ×2 IMPLANT
SPLINT FAST PLASTER 5X30 (CAST SUPPLIES) ×20
SPLINT PLASTER CAST FAST 5X30 (CAST SUPPLIES) ×20 IMPLANT
SPONGE GAUZE 4X4 12PLY (GAUZE/BANDAGES/DRESSINGS) ×2 IMPLANT
SPONGE LAP 18X18 X RAY DECT (DISPOSABLE) ×2 IMPLANT
STOCKINETTE 6  STRL (DRAPES) ×1
STOCKINETTE 6 STRL (DRAPES) ×1 IMPLANT
SUCTION FRAZIER TIP 10 FR DISP (SUCTIONS) ×2 IMPLANT
SUT ETHILON 3 0 PS 1 (SUTURE) ×2 IMPLANT
SUT FIBERWIRE #2 38 T-5 BLUE (SUTURE)
SUT MNCRL AB 3-0 PS2 18 (SUTURE) ×2 IMPLANT
SUT VIC AB 0 SH 27 (SUTURE) ×1 IMPLANT
SUT VIC AB 2-0 SH 27 (SUTURE)
SUT VIC AB 2-0 SH 27XBRD (SUTURE) IMPLANT
SUT VICRYL 4-0 PS2 18IN ABS (SUTURE) IMPLANT
SUTURE FIBERWR #2 38 T-5 BLUE (SUTURE) IMPLANT
SYR BULB 3OZ (MISCELLANEOUS) ×2 IMPLANT
SYR CONTROL 10ML LL (SYRINGE) ×1 IMPLANT
TOWEL OR 17X24 6PK STRL BLUE (TOWEL DISPOSABLE) ×2 IMPLANT
TOWEL OR NON WOVEN STRL DISP B (DISPOSABLE) ×2 IMPLANT
TUBE CONNECTING 20X1/4 (TUBING) ×1 IMPLANT
UNDERPAD 30X30 INCONTINENT (UNDERPADS AND DIAPERS) ×2 IMPLANT

## 2012-10-16 NOTE — Progress Notes (Signed)
Assisted Dr. Crews with right, ultrasound guided, popliteal block. Side rails up, monitors on throughout procedure. See vital signs in flow sheet. Tolerated Procedure well. 

## 2012-10-16 NOTE — Anesthesia Preprocedure Evaluation (Addendum)
Anesthesia Evaluation  Patient identified by MRN, date of birth, ID band Patient awake    Reviewed: Allergy & Precautions  History of Anesthesia Complications (+) PONV  Airway Mallampati: I TM Distance: >3 FB Neck ROM: Full    Dental  (+) Teeth Intact and Dental Advisory Given   Pulmonary  breath sounds clear to auscultation        Cardiovascular Rhythm:Regular Rate:Normal     Neuro/Psych PSYCHIATRIC DISORDERS Anxiety Depression  Neuromuscular disease    GI/Hepatic GERD-  Controlled,  Endo/Other    Renal/GU      Musculoskeletal   Abdominal   Peds  Hematology   Anesthesia Other Findings   Reproductive/Obstetrics                          Anesthesia Physical Anesthesia Plan  ASA: II  Anesthesia Plan: General   Post-op Pain Management:    Induction: Intravenous  Airway Management Planned: LMA  Additional Equipment:   Intra-op Plan:   Post-operative Plan: Extubation in OR  Informed Consent: I have reviewed the patients History and Physical, chart, labs and discussed the procedure including the risks, benefits and alternatives for the proposed anesthesia with the patient or authorized representative who has indicated his/her understanding and acceptance.   Dental advisory given  Plan Discussed with: CRNA, Anesthesiologist and Surgeon  Anesthesia Plan Comments: (Scopolamine patch 1.5 mg.)        Anesthesia Quick Evaluation

## 2012-10-16 NOTE — Transfer of Care (Signed)
Immediate Anesthesia Transfer of Care Note  Patient: Molly Fitzgerald  Procedure(s) Performed: Procedure(s): OPEN REDUCTION INTERNAL FIXATION (ORIF) RIGHT ANKLE FRACTURE (Right)  Patient Location: PACU  Anesthesia Type:GA combined with regional for post-op pain  Level of Consciousness: awake, alert , oriented and patient cooperative  Airway & Oxygen Therapy: Patient Spontanous Breathing and Patient connected to face mask oxygen  Post-op Assessment: Report given to PACU RN and Post -op Vital signs reviewed and stable  Post vital signs: Reviewed and stable  Complications: No apparent anesthesia complications

## 2012-10-16 NOTE — Brief Op Note (Signed)
10/16/2012  11:06 AM  PATIENT:  Molly Fitzgerald  47 y.o. female  PRE-OPERATIVE DIAGNOSIS:  RIGHT ANKLE LATERAL MALLEOULUS FRACTURE   POST-OPERATIVE DIAGNOSIS:  Right Ankle Lateral Malleolus Fracture  Procedure(s): OPEN REDUCTION INTERNAL FIXATION (ORIF) RIGHT ANKLE FRACTURE Stress exam of right ankle under fluoro  SURGEON:  Toni Arthurs, MD  ASSISTANT: n/a  ANESTHESIA:   General, regional  EBL:  minimal   TOURNIQUET:   Total Tourniquet Time Documented: Thigh (Right) - 29 minutes Total: Thigh (Right) - 29 minutes   COMPLICATIONS:  None apparent  DISPOSITION:  Extubated, awake and stable to recovery.  DICTATION ID:  [id number]

## 2012-10-16 NOTE — H&P (Signed)
Molly Fitzgerald is an 47 y.o. female.   Chief Complaint:  Right ankle fracture HPI:  47 y/o female with right ankle fracture after a fall about 10 days ago.  She presents now for ORIF of the displaced lateral malleolus fracture.  Past Medical History  Diagnosis Date  . GERD (gastroesophageal reflux disease)   . DVT (deep venous thrombosis) 2012    left leg after surgery   . 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 Sciatica   . Anxiety   . Depression   . PONV (postoperative nausea and vomiting)   . Contact lens/glasses fitting     wears contacts or glasses    Past Surgical History  Procedure Laterality Date  . Tumor removed Left 2012    from left knee  . Carpal tunnel release Left 2008  . Lumbar disc surgery  2014    L4-5 S1  . Back surgery      Family History  Problem Relation Age of Onset  . Arthritis Mother   . Hypertension Mother   . Hypertension Father   . Stroke Neg Hx   . Hyperlipidemia Neg Hx   . Heart disease Neg Hx   . Diabetes Neg Hx   . Alcohol abuse Neg Hx   . Cancer Neg Hx   . COPD Neg Hx    Social History:  reports that she has never smoked. She has never used smokeless tobacco. She reports that she drinks about 1.8 ounces of alcohol per week. Her drug history is not on file.  Allergies:  Allergies  Allergen Reactions  . Other Anaphylaxis    [place]   . Benadryl (Diphenhydramine Hcl)     Dizzy-  . Latex Other (See Comments)    Patient prefers to not use latex  . Sulfa Antibiotics Hives    Medications Prior to Admission  Medication Sig Dispense Refill  . ALPRAZolam (XANAX) 1 MG tablet Take 1 mg by mouth 3 (three) times daily as needed for anxiety.      Marland Kitchen HYDROcodone-acetaminophen (NORCO/VICODIN) 5-325 MG per tablet Take 1 tablet by mouth every 6 (six) hours as needed for pain.      Marland Kitchen l-methylfolate-b2-b6-b12 (CEREFOLIN) 08-10-48-5 MG TABS Take 1 tablet by mouth daily.      . Methocarbamol (ROBAXIN PO) Take by mouth.      .  Phenylephrine-DM-GG-APAP [facility] CHILD MULTI-SYMPTOM PO) Take 1 tablet by mouth daily as needed (for allergies).      . pseudoephedrine-guaifenesin (MUCINEX D) 60-600 MG per tablet Take 1 tablet by mouth every 12 (twelve) hours.      . sertraline (ZOLOFT) 100 MG tablet Take 100 mg by mouth daily. Takes 2 in am        Results for orders placed during the hospital encounter of 10/16/12 (from the past 48 hour(s))  POCT HEMOGLOBIN-HEMACUE     Status: None   Collection Time    10/16/12  8:00 AM      Result Value Range   Hemoglobin 12.0  12.0 - 15.0 g/dL   No results found.  ROS  No recent f/c/n/v/wt loss  Blood pressure 132/74, pulse 93, temperature 98.5 F (36.9 C), temperature source Oral, resp. rate 20, height 5\' 1"  (1.549 m), weight 81.647 kg (180 lb), last menstrual period 10/13/2012, SpO2 100.00%. Physical Exam  wn wd woman in nad.  A and o x 4.  Mood and affect normal.  EOMI.  Resp unlabored.  R ankle with healthy skin.  TTP laterally.  Sens to LT intact about the foot and ankle.  5/5 strength in PF and DF of the ankle and foot.  Assessment/Plan Right ankle lateral malleolus fracture.  To OR for ORIF of the displaced lateral malleolus fracture.  The risks and benefits of the alternative treatment options have been discussed in detail.  The patient wishes to proceed with surgery and specifically understands risks of bleeding, infection, nerve damage, blood clots, need for additional surgery, amputation and death.   Scottie Metayer 10/20/12, 9:30 AM

## 2012-10-16 NOTE — Anesthesia Procedure Notes (Signed)
Anesthesia Regional Block:  Popliteal block  Pre-Anesthetic Checklist: ,, timeout performed, Correct Patient, Correct Site, Correct Laterality, Correct Procedure, Correct Position, site marked, Risks and benefits discussed,  Surgical consent,  Pre-op evaluation,  At surgeon's request and post-op pain management  Laterality: Right and Lower  Prep: chloraprep       Needles:  Injection technique: Single-shot  Needle Type: Echogenic Needle     Needle Length: 9cm  Needle Gauge: 21    Additional Needles:  Procedures: ultrasound guided (picture in chart) Popliteal block Narrative:  Start time: 10/16/2012 8:37 AM End time: 10/16/2012 8:43 AM Injection made incrementally with aspirations every 5 mL.  Performed by: Personally  Anesthesiologist: Sheldon Silvan, MD  Popliteal block

## 2012-10-16 NOTE — Anesthesia Postprocedure Evaluation (Signed)
  Anesthesia Post-op Note  Patient: Molly Fitzgerald  Procedure(s) Performed: Procedure(s): OPEN REDUCTION INTERNAL FIXATION (ORIF) RIGHT ANKLE FRACTURE (Right)  Patient Location: PACU  Anesthesia Type:GA combined with regional for post-op pain  Level of Consciousness: awake, alert  and oriented  Airway and Oxygen Therapy: Patient Spontanous Breathing  Post-op Pain: mild  Post-op Assessment: Post-op Vital signs reviewed  Post-op Vital Signs: Reviewed  Complications: No apparent anesthesia complications

## 2012-10-17 ENCOUNTER — Encounter (HOSPITAL_BASED_OUTPATIENT_CLINIC_OR_DEPARTMENT_OTHER): Payer: Self-pay | Admitting: Orthopedic Surgery

## 2012-10-17 NOTE — Op Note (Signed)
Molly Fitzgerald, Molly Fitzgerald              ACCOUNT NO.:  [id number]  MEDICAL RECORD NO.:  [id number]  LOCATION:                                 FACILITY:  PHYSICIAN:  Toni Arthurs, MD        DATE OF BIRTH:  29-Jun-1965  DATE OF PROCEDURE:  10/16/2012 DATE OF DISCHARGE:                              OPERATIVE REPORT   PREOPERATIVE DIAGNOSIS:  Right ankle lateral malleolus fracture.  POSTOPERATIVE DIAGNOSIS:  Right ankle lateral malleolus fracture.  PROCEDURES: 1. Open reduction and internal fixation of right ankle lateral     malleolus fracture. 2. Stress examination of right ankle under fluoroscopy.  SURGEON:  Toni Arthurs, MD  ANESTHESIA:  General, regional.  ESTIMATED BLOOD LOSS:  Minimal.  TOURNIQUET TIME:  29 minutes at 250 mmHg.  COMPLICATIONS:  None apparent.  DISPOSITION:  Extubated, awake, and stable to recovery room.  INDICATIONS FOR PROCEDURE:  The patient is a 47 year old female who slipped and twisted her ankle about 10 days ago.  X-rays showed a displaced lateral malleolus fracture.  She presents now for operative treatment of this injury.  She understands the risks, benefits, the alternative treatment options.  She elects to proceed with surgical treatment and specifically understands risks of bleeding, infection, nerve damage, blood clots, need for additional surgery, amputation, and death.  PROCEDURE IN DETAIL:  After preoperative consent was obtained and correct operative site was identified, the patient was brought to the operating room and placed supine on the operating table.  General anesthesia was induced.  Preoperative antibiotics were administered. Surgical time-out was taken.  The right lower extremity was prepped and draped in standard sterile fashion with a tourniquet around the thigh. The extremity was exsanguinated.  The tourniquet was inflated to 250 mmHg.  A longitudinal incision was made over the lateral malleolus. Sharp dissection was carried  down through the skin, subcutaneous tissue. Fracture site was identified.  It was opened, irrigated, and cleaned of all hematoma.  Fracture was reduced and held with a tenaculum.  A 3.5-mm lag screw was inserted from posterior to anterior, perpendicular to the fracture line.  It was noted to have excellent purchase.  A 6-hole 1/3rd tubular plate was then contoured to fit the lateral malleolus.  It was fixed distally with 3 unicortical screws and proximally with 3 bicortical screws.  AP and lateral fluoroscopic images confirmed appropriate position and length of all hardware and appropriate reduction of the fracture.  A mortise view was then obtained.  Dorsiflexion and external rotation stress was applied with the forefoot in varus.  No widening of the ankle mortise and the medial clear space were noted.  The wound was irrigated copiously.  Inverted simple sutures of 3-0 Monocryl were used to close the subcutaneous tissue and a running 3-0 nylon was used to close the skin incision.  Sterile dressings were applied followed by a well-padded short-leg splint.  Tourniquet was then deflated at 29 minutes.  The patient was awakened from anesthesia and transported to the recovery room in stable condition.  FOLLOWUP PLAN:  The patient will be nonweightbearing on the right lower extremity.  She will follow up with me in 2 weeks for  suture removal and conversion to a cast.     Toni Arthurs, MD     JH/MEDQ  D:  10/16/2012  T:  10/17/2012  Job:  [id number]

## 2013-01-18 ENCOUNTER — Emergency Department (INDEPENDENT_AMBULATORY_CARE_PROVIDER_SITE_OTHER)
Admission: EM | Admit: 2013-01-18 | Discharge: 2013-01-18 | Disposition: A | Discharge status: Home / Self Care | Payer: No Typology Code available for payment source | Source: Home / Self Care | Attending: Family Medicine | Admitting: Family Medicine

## 2013-01-18 ENCOUNTER — Emergency Department (INDEPENDENT_AMBULATORY_CARE_PROVIDER_SITE_OTHER): Payer: No Typology Code available for payment source

## 2013-01-18 DIAGNOSIS — W19XXXA Unspecified fall, initial encounter: Secondary | ICD-10-CM

## 2013-01-18 DIAGNOSIS — S42253A Displaced fracture of greater tuberosity of unspecified humerus, initial encounter for closed fracture: Secondary | ICD-10-CM

## 2013-01-18 DIAGNOSIS — S8263XA Displaced fracture of lateral malleolus of unspecified fibula, initial encounter for closed fracture: Secondary | ICD-10-CM

## 2013-01-18 DIAGNOSIS — S42251A Displaced fracture of greater tuberosity of right humerus, initial encounter for closed fracture: Secondary | ICD-10-CM

## 2013-01-18 MED ORDER — HYDROCODONE-ACETAMINOPHEN 5-325 MG PO TABS
2.0000 | ORAL_TABLET | Freq: Once | ORAL | Status: AC
  Administered 2013-01-18: 2 via ORAL

## 2013-01-18 MED ORDER — HYDROCODONE-ACETAMINOPHEN 5-325 MG PO TABS: 1.0000 | ORAL_TABLET | Freq: Four times a day (QID) | ORAL | Status: DC | PRN

## 2013-01-18 MED ORDER — HYDROCODONE-ACETAMINOPHEN 5-325 MG PO TABS
ORAL_TABLET | ORAL | Status: AC
  Filled 2013-01-18: qty 2

## 2013-01-18 NOTE — ED Provider Notes (Signed)
Medical screening examination/treatment/procedure(s) were performed by resident physician or non-physician practitioner and as supervising physician I was immediately available for consultation/collaboration.   KINDL,JAMES DOUGLAS MD.   James D Kindl, MD 01/18/13 1956 

## 2013-01-18 NOTE — ED Provider Notes (Signed)
CSN: [id number]     Arrival date & time 01/18/13  1008 History   First MD Initiated Contact with Patient 01/18/13 1112     No chief complaint on file.  (Consider location/radiation/quality/duration/timing/severity/associated sxs/prior Treatment) HPI Comments: 47 year old female presents for evaluation of right shoulder and arm pain. She was walking her dog 2 days ago when the dog pulled her arm. The dog yanked her arm out and she fell down on the right shoulder. She had immediate pain in the right upper arm and shoulder. Since then, she has had decreased range of motion, pain, swelling, and some swelling down in her hand. She also has a numb sensation in her hand. She is unable to reach her arm above her head in any movement causes severe pain. Denies any previous history of shoulder injuries and denies any other injuries. She did not reach her hand out to catch herself when she fell because her arm was pulled out above her head by the dog leash.   Past Medical History  Diagnosis Date  . GERD (gastroesophageal reflux disease)   . DVT (deep venous thrombosis) 2012    left leg after surgery   . 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 Sciatica   . Anxiety   . Depression   . PONV (postoperative nausea and vomiting)   . Contact lens/glasses fitting     wears contacts or glasses   Past Surgical History  Procedure Laterality Date  . Tumor removed Left 2012    from left knee  . Carpal tunnel release Left 2008  . Lumbar disc surgery  2014    L4-5 S1  . Back surgery    . Orif ankle fracture Right 10/16/2012    Procedure: OPEN REDUCTION INTERNAL FIXATION (ORIF) RIGHT ANKLE FRACTURE;  Surgeon: Toni Arthurs, MD;  Location: [facility];  Service: Orthopedics;  Laterality: Right;   Family History  Problem Relation Age of Onset  . Arthritis Mother   . Hypertension Mother   . Hypertension Father   . Stroke Neg Hx   . Hyperlipidemia Neg Hx   . Heart disease Neg Hx    . Diabetes Neg Hx   . Alcohol abuse Neg Hx   . Cancer Neg Hx   . COPD Neg Hx    History  Substance Use Topics  . Smoking status: Never Smoker   . Smokeless tobacco: Never Used  . Alcohol Use: 1.8 oz/week    3 Cans of beer per week   OB History   Grav Para Term Preterm Abortions TAB SAB Ect Mult Living                 Review of Systems  Constitutional: Negative for fever and chills.  Eyes: Negative for visual disturbance.  Respiratory: Negative for cough and shortness of breath.   Cardiovascular: Negative for chest pain, palpitations and leg swelling.  Gastrointestinal: Negative for nausea, vomiting and abdominal pain.  Endocrine: Negative for polydipsia and polyuria.  Genitourinary: Negative for dysuria, urgency and frequency.  Musculoskeletal: Positive for arthralgias (see history of present illness). Negative for myalgias.  Skin: Negative for rash.  Neurological: Negative for dizziness, weakness and light-headedness.    Allergies  Other; Benadryl; Latex; and Sulfa antibiotics  Home Medications   Current Outpatient Rx  Name  Route  Sig  Dispense  Refill  . ALPRAZolam (XANAX) 1 MG tablet   Oral   Take 1 mg by mouth  3 (three) times daily as needed for anxiety.         Marland Kitchen aspirin EC 325 MG tablet   Oral   Take 1 tablet (325 mg total) by mouth daily.   42 tablet   0   . HYDROcodone-acetaminophen (NORCO/VICODIN) 5-325 MG per tablet   Oral   Take 1 tablet by mouth every 6 (six) hours as needed.   30 tablet   0   . l-methylfolate-b2-b6-b12 (CEREFOLIN) 08-10-48-5 MG TABS   Oral   Take 1 tablet by mouth daily.         . Methocarbamol (ROBAXIN PO)   Oral   Take by mouth.         . oxyCODONE (ROXICODONE) 5 MG immediate release tablet   Oral   Take 1-2 tablets (5-10 mg total) by mouth every 4 (four) hours as needed for pain.   50 tablet   0   . Phenylephrine-DM-GG-APAP (MUCINEX CHILD MULTI-SYMPTOM PO)   Oral   Take 1 tablet by mouth daily as needed (for  allergies).         . sertraline (ZOLOFT) 100 MG tablet   Oral   Take 100 mg by mouth daily. Takes 2 in am          BP 140/80  Pulse 82  Temp(Src) 98.8 F (37.1 C) (Oral)  Resp 18  SpO2 100% Physical Exam  Nursing note and vitals reviewed. Constitutional: She is oriented to person, place, and time. Vital signs are normal. She appears well-developed and well-nourished. No distress.  HENT:  Head: Normocephalic and atraumatic.  Pulmonary/Chest: Effort normal. No respiratory distress.  Musculoskeletal:       Right shoulder: She exhibits decreased range of motion, tenderness (Diffuse tenderness, worse at the [facility] joint line but also involving the deltoid musculature), bony tenderness and pain. She exhibits no swelling, no crepitus, no deformity, no spasm, normal pulse and normal strength.       Right elbow: Normal.      Right wrist: Normal.       Right hand: She exhibits no tenderness, normal capillary refill and no swelling. Decreased sensation (Slight, in the fingers) noted. Normal strength noted.  Neurological: She is alert and oriented to person, place, and time. She has normal strength. Coordination normal.  Skin: Skin is warm and dry. No rash noted. She is not diaphoretic.  Psychiatric: She has a normal mood and affect. Judgment normal.    ED Course  Procedures (including critical care time) Labs Review Labs Reviewed - No data to display Imaging Review Dg Shoulder Right  01/18/2013   CLINICAL DATA:  Fall, shoulder pain  EXAM: RIGHT SHOULDER - 2+ VIEW  COMPARISON:  None.  FINDINGS: Nondisplaced greater tuberosity fracture.  No evidence of dislocation.  Soft tissues are unremarkable.  Visualized right lung is clear.  IMPRESSION: Nondisplaced greater tuberosity fracture.   Electronically Signed   By: Charline Bills M.D.   On: 01/18/2013 12:11      MDM   1. Greater tuberosity of humerus fracture, right, closed, initial encounter    Discussed with the orthopedic surgeon  on call. Placing patient in a sling immobilizer, she are has a follow up with Dr. Victorino Dike for her ankle fracture this week so she will discuss this problem with Dr. Victorino Dike at that time. Norco as needed for pain.   Meds ordered this encounter  Medications  . HYDROcodone-acetaminophen (NORCO/VICODIN) 5-325 MG per tablet    Sig: Take 1 tablet by mouth  every 6 (six) hours as needed.    Dispense:  30 tablet    Refill:  0    Order Specific Question:  Supervising Provider    Answer:  Bradd Canary D [[id number]]       Graylon Good, PA-C 01/18/13 1242

## 2013-04-29 LAB — HM PAP SMEAR: HM Pap smear: NORMAL

## 2013-05-27 LAB — HM MAMMOGRAPHY: HM MAMMO: NORMAL

## 2013-06-04 ENCOUNTER — Emergency Department (INDEPENDENT_AMBULATORY_CARE_PROVIDER_SITE_OTHER): Payer: No Typology Code available for payment source

## 2013-06-04 ENCOUNTER — Encounter (HOSPITAL_COMMUNITY): Payer: Self-pay | Admitting: Emergency Medicine

## 2013-06-04 ENCOUNTER — Emergency Department (INDEPENDENT_AMBULATORY_CARE_PROVIDER_SITE_OTHER)
Admission: EM | Admit: 2013-06-04 | Discharge: 2013-06-04 | Disposition: A | Discharge status: Home / Self Care | Payer: Self-pay | Source: Home / Self Care | Attending: Family Medicine | Admitting: Family Medicine

## 2013-06-04 DIAGNOSIS — S20219A Contusion of unspecified front wall of thorax, initial encounter: Secondary | ICD-10-CM

## 2013-06-04 DIAGNOSIS — W19XXXA Unspecified fall, initial encounter: Secondary | ICD-10-CM

## 2013-06-04 DIAGNOSIS — S4992XA Unspecified injury of left shoulder and upper arm, initial encounter: Secondary | ICD-10-CM

## 2013-06-04 DIAGNOSIS — S4980XA Other specified injuries of shoulder and upper arm, unspecified arm, initial encounter: Secondary | ICD-10-CM

## 2013-06-04 DIAGNOSIS — S46909A Unspecified injury of unspecified muscle, fascia and tendon at shoulder and upper arm level, unspecified arm, initial encounter: Secondary | ICD-10-CM

## 2013-06-04 MED ORDER — HYDROCODONE-ACETAMINOPHEN 5-325 MG PO TABS: 1.0000 | ORAL_TABLET | Freq: Four times a day (QID) | ORAL | Status: DC | PRN

## 2013-06-04 NOTE — Discharge Instructions (Signed)
Thank you for coming in today. Work on gentle range of motion exercises.  Take Norco for pain as needed. Take frequent deep breaths. Followup as per orthopedics.  Blunt Chest Trauma Blunt chest trauma is an injury caused by a blow to the chest. These chest injuries can be very painful. Blunt chest trauma often results in bruised or broken (fractured) ribs. Most cases of bruised and fractured ribs from blunt chest traumas get better after 1 to 3 weeks of rest and pain medicine. Often, the soft tissue in the chest wall is also injured, causing pain and bruising. Internal organs, such as the heart and lungs, may also be injured. Blunt chest trauma can lead to serious medical problems. This injury requires immediate medical care. CAUSES   Motor vehicle collisions.  Falls.  Physical violence.  Sports injuries. SYMPTOMS   Chest pain. The pain may be worse when you move or breathe deeply.  Shortness of breath.  Lightheadedness.  Bruising.  Tenderness.  Swelling. DIAGNOSIS  Your caregiver will do a physical exam. X-rays may be taken to look for fractures. However, minor rib fractures may not show up on X-rays until a few days after the injury. If a more serious injury is suspected, further imaging tests may be done. This may include ultrasounds, computed tomography (CT) scans, or magnetic resonance imaging (MRI). TREATMENT  Treatment depends on the severity of your injury. Your caregiver may prescribe pain medicines and deep breathing exercises. HOME CARE INSTRUCTIONS  Limit your activities until you can move around without much pain.  Do not do any strenuous work until your injury is healed.  Put ice on the injured area.  Put ice in a plastic bag.  Place a towel between your skin and the bag.  Leave the ice on for 15-20 minutes, 03-04 times a day.  You may wear a rib belt as directed by your caregiver to reduce pain.  Practice deep breathing as directed by your caregiver to  keep your lungs clear.  Only take over-the-counter or prescription medicines for pain, fever, or discomfort as directed by your caregiver. SEEK IMMEDIATE MEDICAL CARE IF:   You have increasing pain or shortness of breath.  You cough up blood.  You have nausea, vomiting, or abdominal pain.  You have a fever.  You feel dizzy, weak, or you faint. MAKE SURE YOU:  Understand these instructions.  Will watch your condition.  Will get help right away if you are not doing well or get worse. Document Released: 04/05/2004 Document Revised: 05/21/2011 Document Reviewed: 12/13/2010 [facility] Patient Information 2014 [place]. Rotator Cuff Tear The rotator cuff is four tendons that assist in the motion of the shoulder. A rotator cuff tear is a tear in one of these four tendons. It is characterized by pain and weakness of the shoulder. The rotator cuff tendons surround the shoulder ball and socket joint (humeral head). The rotator cuff tendons attach to the shoulder blade (scapula) on one side and the upper arm bone (humerus) on the other side. The rotator cuff is essential for shoulder stability and shoulder motion. SYMPTOMS   Pain around the shoulder, often at the outer portion of the upper arm.  Pain that is worse with shoulder function, especially when reaching overhead or lifting.  Weakness of the shoulder muscles.  Aching when not using your arm; often, pain awakens you at night, especially when sleeping on the affected side.  Tenderness, swelling, warmth, or redness over the outer aspect of the shoulder.  Loss of strength.  Limited motion of the shoulder, especially reaching behind (reaching into one's back pocket) or across your body.  A crackling sound (crepitation) when moving the shoulder.  Biceps tendon pain (in the front of the shoulder) and inflammation, worse with bending the elbow or lifting. CAUSES   Strain from sudden increase in amount or intensity of  activity.  Direct blow or injury to the shoulder.  Aging, wear from from normal use.  Roof of the shoulder (acromial) spur. RISK INCREASES WITH:   Contact sports (football, wrestling, or boxing).  Throwing or hitting sports (baseball, tennis, or volleyball).  Weightlifting and bodybuilding.  Heavy labor.  Previous injury to rotator cuff.  Failure to warm up properly before activity.  Inadequate protective equipment.  Increasing age.  Spurring of the outer end of the scapula (acromion).  Cortisone injections.  Poor shoulder strength and flexibility. PREVENTION  Warm up and stretch properly before activity.  Allow time for rest and recovery between practices and competition.  Maintain physical fitness:  Cardiovascular fitness.  Shoulder flexibility.  Strength and endurance of the rotator cuff muscles and muscles of the shoulder blade.  Learn and use proper technique when throwing or hitting. PROGNOSIS Surgery is often needed. Although, symptoms may go away by themselves. RELATED COMPLICATIONS   Persistent pain that may progress to constant pain.  Shoulder stiffness, frozen shoulder syndrome, or loss of motion.  Recurrence of symptoms, especially if treated without surgery.  Inability to return to same level of sports, even with surgery.  Persistent weakness.  Risks of surgery, including infection, bleeding, injury to nerves, shoulder stiffness, weakness, re-tearing of the rotator cuff tendon.  Deltoid detachment, acromial fracture, and persistent pain. TREATMENT Treatment involves the use of ice and medicine to reduce pain and inflammation. Strengthening and stretching exercise are usually recommended. These exercises may be completed at home or with a therapist. You may also be instructed to modify offending activities. Corticosteroid injections may be given to reduce inflammation. Surgery is usually recommended for athletes. Surgery has the best chance  for a full recovery. Surgery involves:  Removal of an inflamed bursa.  Removal of an acromial spur if present.  Suturing the torn tendon back together. Rotator cuff surgeries may be preformed either arthroscopically or through an open incision. Recovery typically takes 6 to 12 months. MEDICATION  If pain medicine is necessary, then nonsteroidal anti-inflammatory medicines, such as aspirin and ibuprofen, or other minor pain relievers, such as acetaminophen, are often recommended.  Do not take pain medicine for 7 days before surgery.  Prescription pain relievers are usually only prescribed after surgery. Use only as directed and only as much as you need.  Corticosteroid injections may be given to reduce inflammation. However, there is a limited number of times the joint may be injected with these medicines. HEAT AND COLD  Cold treatment (icing) relieves pain and reduces inflammation. Cold treatment should be applied for 10 to 15 minutes every 2 to 3 hours for inflammation and pain and immediately after any activity that aggravates your symptoms. Use ice packs or massage the area with a piece of ice (ice massage).  Heat treatment may be used prior to performing the stretching and strengthening activities prescribed by your caregiver, physical therapist, or athletic trainer. Use a heat pack or soak the injury in warm water. SEEK MEDICAL CARE IF:   Symptoms get worse or do not improve in 4 to 6 weeks despite treatment.  You experience pain, numbness, or coldness in  the hand.  Blue, gray, or dark color appears in the fingernails.  New, unexplained symptoms develop (drugs used in treatment may produce side effects). Document Released: 02/26/2005 Document Revised: 05/21/2011 Document Reviewed: 06/10/2008 [facility] Patient Information 2014 [place], [place].

## 2013-06-04 NOTE — ED Notes (Signed)
C/o fall States she was taking care of her dog while walking him he pulls her arm and made her fall

## 2013-06-04 NOTE — ED Provider Notes (Signed)
Molly Fitzgerald is a 48 y.o. female who presents to Urgent Care today for a fall. Patient was pulled over by her dog today. She had a dog leash in her left hand and fell to the ground on her left ribs with her left arm extended. She notes left anterior rib pain worse with deep breath. Additionally she notes considerable left shoulder pain with inability to abduct the shoulder. She denies any radiating pain weakness or numbness. She denies any trouble breathing. She's tried over-the-counter medications which have not helped much.   Past Medical History  Diagnosis Date  . GERD (gastroesophageal reflux disease)   . DVT (deep venous thrombosis) 2012    left leg after surgery   . 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 Sciatica   . Anxiety   . Depression   . PONV (postoperative nausea and vomiting)   . Contact lens/glasses fitting     wears contacts or glasses   History  Substance Use Topics  . Smoking status: Never Smoker   . Smokeless tobacco: Never Used  . Alcohol Use: 1.8 oz/week    3 Cans of beer per week   ROS as above Medications: No current facility-administered medications for this encounter.   Current Outpatient Prescriptions  Medication Sig Dispense Refill  . ALPRAZolam (XANAX) 1 MG tablet Take 1 mg by mouth 3 (three) times daily as needed for anxiety.      Marland Kitchen aspirin EC 325 MG tablet Take 1 tablet (325 mg total) by mouth daily.  42 tablet  0  . HYDROcodone-acetaminophen (NORCO/VICODIN) 5-325 MG per tablet Take 1 tablet by mouth every 6 (six) hours as needed.  20 tablet  0  . l-methylfolate-b2-b6-b12 (CEREFOLIN) 08-10-48-5 MG TABS Take 1 tablet by mouth daily.      . Methocarbamol (ROBAXIN PO) Take by mouth.      . Phenylephrine-DM-GG-APAP [facility] CHILD MULTI-SYMPTOM PO) Take 1 tablet by mouth daily as needed (for allergies).      . sertraline (ZOLOFT) 100 MG tablet Take 100 mg by mouth daily. Takes 2 in am        Exam:  BP 156/100  Pulse 104   Temp(Src) 98.9 F (37.2 C) (Oral)  Resp 20  SpO2 98%  LMP 06/03/2013 Gen: Well NAD HEENT: EOMI,  MMM Lungs: Normal work of breathing. CTABL Heart: RRR no MRG Abd: NABS, Soft. NT, ND Exts: Brisk capillary refill, warm and well perfused.  Left shoulder: Normal-appearing nontender. Complete lack of active abduction range of motion. Passive full with positive drop arm sign. Significantly weak abduction and forward flexion. Capillary refill and sensation are intact distally   No results found for this or any previous visit (from the past 24 hour(s)). Dg Ribs Unilateral W/chest Left  06/04/2013   CLINICAL DATA:  Recent traumatic injury with left-sided chest pain  EXAM: LEFT RIBS AND CHEST - 3+ VIEW  COMPARISON:  None.  FINDINGS: No fracture or other bone lesions are seen involving the ribs. There is no evidence of pneumothorax or pleural effusion. Both lungs are clear. Heart size and mediastinal contours are within normal limits.  IMPRESSION: No acute rib fracture noted.   Electronically Signed   By: Inez Catalina M.D.   On: 06/04/2013 14:51   Dg Shoulder Left  06/04/2013   CLINICAL DATA:  Left shoulder and proximal arm pain following a fall yesterday.  EXAM: LEFT SHOULDER - 2+ VIEW  COMPARISON:  None.  FINDINGS:  There is no evidence of fracture or dislocation. There is no evidence of arthropathy or other focal bone abnormality. Soft tissues are unremarkable.  IMPRESSION: Normal examination.   Electronically Signed   By: Enrique Sack M.D.   On: 06/04/2013 14:34    Assessment and Plan: 48 y.o. female with   1) left shoulder pain. I was concerned for rotator cuff injury. I'm suspicious that she has a complete tear. Plan to use gentle range of motion exercises, Norco for pain control and followup with orthopedics as soon as possible.  2) rib contusion: Discussed using old incentive spirometer at her home. Norco for pain control  Discussed warning signs or symptoms. Please see discharge  instructions. Patient expresses understanding.    Gregor Hams, MD 06/04/13 [phone]

## 2013-06-05 ENCOUNTER — Ambulatory Visit: Payer: No Typology Code available for payment source | Admitting: Internal Medicine

## 2013-12-14 ENCOUNTER — Other Ambulatory Visit: Payer: Self-pay | Admitting: Internal Medicine

## 2013-12-14 DIAGNOSIS — R1011 Right upper quadrant pain: Secondary | ICD-10-CM

## 2013-12-22 ENCOUNTER — Other Ambulatory Visit: Payer: Self-pay

## 2014-09-01 ENCOUNTER — Telehealth: Payer: Self-pay | Admitting: Internal Medicine

## 2014-09-01 NOTE — Telephone Encounter (Signed)
It has been three years since this patient has seen you.  She is requesting to continue care with you.  Please advise.  Patient also states that her Elenor Legato, Daphene Jaeger was a patient of Dr. Ronnald Ramp.  She wanted to let Dr. Ronnald Ramp know that Mrs. Satira Mccallum has passed away.

## 2014-09-02 NOTE — Telephone Encounter (Signed)
Ok with me 

## 2014-09-02 NOTE — Telephone Encounter (Signed)
Got scheduled  °

## 2014-09-07 ENCOUNTER — Ambulatory Visit (INDEPENDENT_AMBULATORY_CARE_PROVIDER_SITE_OTHER): Payer: BLUE CROSS/BLUE SHIELD | Admitting: Internal Medicine

## 2014-09-07 ENCOUNTER — Encounter: Payer: Self-pay | Admitting: Internal Medicine

## 2014-09-07 VITALS — BP 122/76 | HR 94 | Temp 98.3°F | Resp 16 | Ht 61.0 in | Wt 210.0 lb

## 2014-09-07 DIAGNOSIS — J320 Chronic maxillary sinusitis: Secondary | ICD-10-CM | POA: Diagnosis not present

## 2014-09-07 MED ORDER — AMOXICILLIN-POT CLAVULANATE 875-125 MG PO TABS: 1.0000 | ORAL_TABLET | Freq: Two times a day (BID) | ORAL | Status: AC

## 2014-09-07 NOTE — Progress Notes (Signed)
Pre visit review using our clinic review tool, if applicable. No additional management support is needed unless otherwise documented below in the visit note. 

## 2014-09-07 NOTE — Patient Instructions (Signed)

## 2014-09-07 NOTE — Progress Notes (Signed)
Subjective:  Patient ID: Molly Fitzgerald, female    DOB: [birth year]  Age: 49 y.o. MRN: [id number]  CC: Sinusitis   HPI Molly Fitzgerald presents for worsening sinus drainage over the last few months with facial pain, runny nose, postnasal drip and sneezing. She tells me that she saw an ENT MD about 5 years ago and had a cautery procedure done on her sinuses but she does not know who the MD was or what was cauterized.  Outpatient Prescriptions Prior to Visit  Medication Sig Dispense Refill  . sertraline (ZOLOFT) 100 MG tablet Take 100 mg by mouth daily. Takes 2 in am    . ALPRAZolam (XANAX) 1 MG tablet Take 1 mg by mouth 3 (three) times daily as needed for anxiety.    Marland Kitchen Phenylephrine-DM-GG-APAP (MUCINEX CHILD MULTI-SYMPTOM PO) Take 1 tablet by mouth daily as needed (for allergies).    Marland Kitchen aspirin EC 325 MG tablet Take 1 tablet (325 mg total) by mouth daily. 42 tablet 0  . HYDROcodone-acetaminophen (NORCO/VICODIN) 5-325 MG per tablet Take 1 tablet by mouth every 6 (six) hours as needed. 20 tablet 0  . l-methylfolate-b2-b6-b12 (CEREFOLIN) 08-10-48-5 MG TABS Take 1 tablet by mouth daily.    . Methocarbamol (ROBAXIN PO) Take by mouth.     No facility-administered medications prior to visit.    ROS Review of Systems  Constitutional: Negative.  Negative for fever, chills, diaphoresis, appetite change, fatigue and unexpected weight change.  HENT: Positive for postnasal drip, rhinorrhea, sinus pressure and sneezing. Negative for congestion, ear discharge, ear pain, facial swelling, nosebleeds, sore throat, tinnitus, trouble swallowing and voice change.   Eyes: Negative.   Respiratory: Negative.  Negative for cough, choking, chest tightness, shortness of breath and stridor.   Cardiovascular: Negative.  Negative for chest pain, palpitations and leg swelling.  Gastrointestinal: Negative.  Negative for nausea, vomiting, abdominal pain, diarrhea, constipation and blood in stool.  Endocrine: Negative.    Genitourinary: Negative.   Musculoskeletal: Negative.   Skin: Negative.   Allergic/Immunologic: Negative.   Neurological: Negative.  Negative for dizziness.  Hematological: Negative.  Negative for adenopathy. Does not bruise/bleed easily.  Psychiatric/Behavioral: Negative.     Objective:  BP 122/76 mmHg  Pulse 94  Temp(Src) 98.3 F (36.8 C) (Oral)  Ht 5\' 1"  (1.549 m)  Wt 210 lb (95.255 kg)  BMI 39.70 kg/m2  SpO2 98%  BP Readings from Last 3 Encounters:  09/07/14 122/76  06/04/13 156/100  01/18/13 140/80    Wt Readings from Last 3 Encounters:  09/07/14 210 lb (95.255 kg)  10/16/12 180 lb (81.647 kg)  08/01/11 152 lb (68.947 kg)    Physical Exam  Constitutional: She is oriented to person, place, and time. She appears well-developed and well-nourished.  Non-toxic appearance. She does not have a sickly appearance. She does not appear ill. No distress.  HENT:  Head: Normocephalic and atraumatic.  Right Ear: Hearing, tympanic membrane, external ear and ear canal normal.  Left Ear: Hearing, tympanic membrane, external ear and ear canal normal.  Nose: Mucosal edema and rhinorrhea present. No nose lacerations, sinus tenderness, nasal deformity, septal deviation or nasal septal hematoma. No epistaxis.  No foreign bodies. Right sinus exhibits maxillary sinus tenderness. Right sinus exhibits no frontal sinus tenderness. Left sinus exhibits maxillary sinus tenderness. Left sinus exhibits no frontal sinus tenderness.  Mouth/Throat: Oropharynx is clear and moist and mucous membranes are normal. Mucous membranes are not dry and not cyanotic. No oral lesions. No trismus in the jaw.  No uvula swelling. No oropharyngeal exudate, posterior oropharyngeal edema, posterior oropharyngeal erythema or tonsillar abscesses.  Eyes: Conjunctivae are normal. Right eye exhibits no discharge. Left eye exhibits no discharge. No scleral icterus.  Neck: Normal range of motion. Neck supple. No JVD present. No  tracheal deviation present. No thyromegaly present.  Cardiovascular: Normal rate, regular rhythm, normal heart sounds and intact distal pulses.  Exam reveals no gallop and no friction rub.   No murmur heard. Pulmonary/Chest: Effort normal and breath sounds normal. No stridor. No respiratory distress. She has no wheezes. She has no rales. She exhibits no tenderness.  Abdominal: Soft. Bowel sounds are normal. She exhibits no distension and no mass. There is no tenderness. There is no rebound and no guarding.  Musculoskeletal: Normal range of motion. She exhibits no edema or tenderness.  Lymphadenopathy:    She has no cervical adenopathy.  Neurological: She is oriented to person, place, and time.  Skin: Skin is warm and dry. No rash noted. She is not diaphoretic. No erythema. No pallor.  Psychiatric: She has a normal mood and affect. Her behavior is normal. Judgment and thought content normal.  Vitals reviewed.   Lab Results  Component Value Date   HGB 12.0 10/16/2012    Dg Ribs Unilateral W/chest Left  06/04/2013   CLINICAL DATA:  Recent traumatic injury with left-sided chest pain  EXAM: LEFT RIBS AND CHEST - 3+ VIEW  COMPARISON:  None.  FINDINGS: No fracture or other bone lesions are seen involving the ribs. There is no evidence of pneumothorax or pleural effusion. Both lungs are clear. Heart size and mediastinal contours are within normal limits.  IMPRESSION: No acute rib fracture noted.   Electronically Signed   By: Inez Catalina M.D.   On: 06/04/2013 14:51   Dg Shoulder Left  06/04/2013   CLINICAL DATA:  Left shoulder and proximal arm pain following a fall yesterday.  EXAM: LEFT SHOULDER - 2+ VIEW  COMPARISON:  None.  FINDINGS: There is no evidence of fracture or dislocation. There is no evidence of arthropathy or other focal bone abnormality. Soft tissues are unremarkable.  IMPRESSION: Normal examination.   Electronically Signed   By: Enrique Sack M.D.   On: 06/04/2013 14:34    Assessment  & Plan:   Molly Fitzgerald was seen today for sinusitis.  Diagnoses and all orders for this visit:  Chronic maxillary sinusitis - I will treat the infection with augmentin and I have asked her to f/up with ENT Orders: -     Ambulatory referral to ENT -     amoxicillin-clavulanate (AUGMENTIN) 875-125 MG per tablet; Take 1 tablet by mouth 2 (two) times daily.  I have discontinued Molly Fitzgerald's ALPRAZolam, Phenylephrine-DM-GG-APAP (MUCINEX CHILD MULTI-SYMPTOM PO), Methocarbamol (ROBAXIN PO), l-methylfolate-b2-b6-b12, aspirin EC, and HYDROcodone-acetaminophen. I am also having her start on amoxicillin-clavulanate. Additionally, I am having her maintain her sertraline and nortriptyline.  Meds ordered this encounter  Medications  . nortriptyline (PAMELOR) 50 MG capsule    Sig: Take 50 mg by mouth at bedtime.    Refill:  12  . amoxicillin-clavulanate (AUGMENTIN) 875-125 MG per tablet    Sig: Take 1 tablet by mouth 2 (two) times daily.    Dispense:  28 tablet    Refill:  0     Follow-up: Return in about 4 weeks (around 10/05/2014).  Scarlette Calico, MD

## 2014-09-10 ENCOUNTER — Telehealth: Payer: Self-pay | Admitting: Internal Medicine

## 2014-09-10 NOTE — Telephone Encounter (Signed)
Can you check on her referral to [facility].  Would it be possible for patient to just call to schedule since referral has been sent?

## 2014-09-14 NOTE — Telephone Encounter (Signed)
Pt scheduled  

## 2014-09-18 ENCOUNTER — Encounter (HOSPITAL_COMMUNITY): Payer: Self-pay | Admitting: *Deleted

## 2014-09-18 ENCOUNTER — Emergency Department (HOSPITAL_COMMUNITY): Payer: BLUE CROSS/BLUE SHIELD

## 2014-09-18 ENCOUNTER — Emergency Department (HOSPITAL_COMMUNITY)
Admission: EM | Admit: 2014-09-18 | Discharge: 2014-09-19 | Disposition: A | Discharge status: Home / Self Care | Payer: BLUE CROSS/BLUE SHIELD | Attending: Emergency Medicine | Admitting: Emergency Medicine

## 2014-09-18 DIAGNOSIS — F10920 Alcohol use, unspecified with intoxication, uncomplicated: Secondary | ICD-10-CM

## 2014-09-18 DIAGNOSIS — R200 Anesthesia of skin: Secondary | ICD-10-CM | POA: Diagnosis not present

## 2014-09-18 DIAGNOSIS — Z91040 Latex allergy status: Secondary | ICD-10-CM | POA: Diagnosis not present

## 2014-09-18 DIAGNOSIS — R2240 Localized swelling, mass and lump, unspecified lower limb: Secondary | ICD-10-CM | POA: Insufficient documentation

## 2014-09-18 DIAGNOSIS — R000 Tachycardia, unspecified: Secondary | ICD-10-CM | POA: Insufficient documentation

## 2014-09-18 DIAGNOSIS — Z8719 Personal history of other diseases of the digestive system: Secondary | ICD-10-CM | POA: Insufficient documentation

## 2014-09-18 DIAGNOSIS — F10129 Alcohol abuse with intoxication, unspecified: Secondary | ICD-10-CM | POA: Insufficient documentation

## 2014-09-18 DIAGNOSIS — F419 Anxiety disorder, unspecified: Secondary | ICD-10-CM | POA: Diagnosis not present

## 2014-09-18 DIAGNOSIS — Z86018 Personal history of other benign neoplasm: Secondary | ICD-10-CM | POA: Diagnosis not present

## 2014-09-18 DIAGNOSIS — R110 Nausea: Secondary | ICD-10-CM | POA: Diagnosis not present

## 2014-09-18 DIAGNOSIS — R0789 Other chest pain: Secondary | ICD-10-CM

## 2014-09-18 DIAGNOSIS — R109 Unspecified abdominal pain: Secondary | ICD-10-CM | POA: Insufficient documentation

## 2014-09-18 DIAGNOSIS — R079 Chest pain, unspecified: Secondary | ICD-10-CM | POA: Diagnosis present

## 2014-09-18 DIAGNOSIS — R5383 Other fatigue: Secondary | ICD-10-CM | POA: Diagnosis not present

## 2014-09-18 DIAGNOSIS — R0602 Shortness of breath: Secondary | ICD-10-CM | POA: Insufficient documentation

## 2014-09-18 DIAGNOSIS — Z86718 Personal history of other venous thrombosis and embolism: Secondary | ICD-10-CM | POA: Insufficient documentation

## 2014-09-18 DIAGNOSIS — Z79899 Other long term (current) drug therapy: Secondary | ICD-10-CM | POA: Insufficient documentation

## 2014-09-18 LAB — CBC WITH DIFFERENTIAL/PLATELET
BASOS PCT: 1 % (ref 0–1)
Basophils Absolute: 0 10*3/uL (ref 0.0–0.1)
EOS ABS: 0.4 10*3/uL (ref 0.0–0.7)
EOS PCT: 7 % — AB (ref 0–5)
HCT: 42.6 % (ref 36.0–46.0)
HEMOGLOBIN: 14.6 g/dL (ref 12.0–15.0)
LYMPHS ABS: 1.3 10*3/uL (ref 0.7–4.0)
Lymphocytes Relative: 23 % (ref 12–46)
MCH: 34.4 pg — ABNORMAL HIGH (ref 26.0–34.0)
MCHC: 34.3 g/dL (ref 30.0–36.0)
MCV: 100.5 fL — AB (ref 78.0–100.0)
MONO ABS: 0.6 10*3/uL (ref 0.1–1.0)
MONOS PCT: 10 % (ref 3–12)
Neutro Abs: 3.5 10*3/uL (ref 1.7–7.7)
Neutrophils Relative %: 60 % (ref 43–77)
Platelets: 106 10*3/uL — ABNORMAL LOW (ref 150–400)
RBC: 4.24 MIL/uL (ref 3.87–5.11)
RDW: 13.4 % (ref 11.5–15.5)
WBC: 5.9 10*3/uL (ref 4.0–10.5)

## 2014-09-18 LAB — BASIC METABOLIC PANEL
Anion gap: 16 — ABNORMAL HIGH (ref 5–15)
CALCIUM: 9.1 mg/dL (ref 8.9–10.3)
CHLORIDE: 101 mmol/L (ref 101–111)
CO2: 22 mmol/L (ref 22–32)
CREATININE: 0.8 mg/dL (ref 0.44–1.00)
GFR calc non Af Amer: >60 mL/min (ref >60)
Glucose, Bld: 96 mg/dL (ref 65–99)
Potassium: 4.1 mmol/L (ref 3.5–5.1)
Sodium: 139 mmol/L (ref 135–145)

## 2014-09-18 LAB — HEPATIC FUNCTION PANEL
ALBUMIN: 4.1 g/dL (ref 3.5–5.0)
ALT: 134 U/L — ABNORMAL HIGH (ref 14–54)
AST: 228 U/L — AB (ref 15–41)
Alkaline Phosphatase: 165 U/L — ABNORMAL HIGH (ref 38–126)
BILIRUBIN TOTAL: 0.5 mg/dL (ref 0.3–1.2)
Bilirubin, Direct: 0.1 mg/dL (ref 0.1–0.5)
Indirect Bilirubin: 0.4 mg/dL (ref 0.3–0.9)
Total Protein: 7.9 g/dL (ref 6.5–8.1)

## 2014-09-18 LAB — TROPONIN I: Troponin I: <0.03 ng/mL (ref <0.031)

## 2014-09-18 LAB — BRAIN NATRIURETIC PEPTIDE: B Natriuretic Peptide: 16.4 pg/mL (ref 0.0–100.0)

## 2014-09-18 LAB — ETHANOL: Alcohol, Ethyl (B): 396 mg/dL (ref <5)

## 2014-09-18 MED ORDER — KETOROLAC TROMETHAMINE 30 MG/ML IJ SOLN
30.0000 mg | Freq: Once | INTRAMUSCULAR | Status: AC
  Administered 2014-09-18: 30 mg via INTRAVENOUS
  Filled 2014-09-18: qty 1

## 2014-09-18 MED ORDER — CHLORDIAZEPOXIDE HCL 25 MG PO CAPS: ORAL_CAPSULE | ORAL | Status: AC

## 2014-09-18 MED ORDER — ONDANSETRON HCL 4 MG/2ML IJ SOLN
4.0000 mg | Freq: Once | INTRAMUSCULAR | Status: AC
  Administered 2014-09-18: 4 mg via INTRAVENOUS
  Filled 2014-09-18: qty 2

## 2014-09-18 MED ORDER — SODIUM CHLORIDE 0.9 % IV BOLUS (SEPSIS)
1000.0000 mL | Freq: Once | INTRAVENOUS | Status: AC
  Administered 2014-09-18: 1000 mL via INTRAVENOUS

## 2014-09-18 NOTE — Discharge Instructions (Signed)
As discussed, it is important that you follow-up with your primary care physician, and take all medication as directed.  Please return here for concerning changes in your condition.

## 2014-09-18 NOTE — ED Notes (Signed)
The pt is c/o  Chest pain headache with n v and diarrhea for 2hours  No known history.  Lm,p none  She has had some alcohol

## 2014-09-18 NOTE — ED Notes (Signed)
Dr. Liston Alba aware of liver enzyme results. No new orders.

## 2014-09-18 NOTE — ED Notes (Signed)
IV attempt X2. Mica RN at bedside attempting

## 2014-09-18 NOTE — ED Provider Notes (Signed)
CSN: [id number]     Arrival date & time 09/18/14  2005 History   First MD Initiated Contact with Patient 09/18/14 2113     Chief Complaint  Patient presents with  . Chest Pain    HPI Patient is a 49 year old Caucasian female with a history of DVT anxiety alcohol abuse presented today for acute onset of headache, left-sided numbness, Abdominal pain, shortness of breath, and chest pain. She also reports she had some loss of vision which has subsequently resolved. Reports chest pain as a generalized pressure, non-radiating, rated as a 7/10 with no aggravating or alleviating factors. Denies any frank weakness of her left face or left upper extremity. Reports possible history of TIA in the past but no record identified in the computer. Patient continued to have left-sided numbness with chest pain subsequently brought to the emergency department further evaluation.    Past Medical History  Diagnosis Date  . GERD (gastroesophageal reflux disease)   . DVT (deep venous thrombosis) 2012    left leg after surgery   . 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 Sciatica   . Anxiety   . Depression   . PONV (postoperative nausea and vomiting)   . Contact lens/glasses fitting     wears contacts or glasses   Past Surgical History  Procedure Laterality Date  . Tumor removed Left 2012    from left knee  . Carpal tunnel release Left 2008  . Lumbar disc surgery  2014    L4-5 S1  . Back surgery    . Orif ankle fracture Right 10/16/2012    Procedure: OPEN REDUCTION INTERNAL FIXATION (ORIF) RIGHT ANKLE FRACTURE;  Surgeon: Wylene Simmer, MD;  Location: [facility];  Service: Orthopedics;  Laterality: Right;   Family History  Problem Relation Age of Onset  . Arthritis Mother   . Hypertension Mother   . Hypertension Father   . Stroke Neg Hx   . Hyperlipidemia Neg Hx   . Heart disease Neg Hx   . Diabetes Neg Hx   . Alcohol abuse Neg Hx   . Cancer Neg Hx   . COPD Neg Hx     History  Substance Use Topics  . Smoking status: Never Smoker   . Smokeless tobacco: Never Used  . Alcohol Use: 1.8 oz/week    3 Cans of beer per week   OB History    No data available     Review of Systems  Constitutional: Positive for fatigue. Negative for fever and chills.  HENT: Positive for facial swelling. Negative for congestion and sore throat.   Eyes: Negative for pain.  Respiratory: Positive for shortness of breath. Negative for cough.   Cardiovascular: Positive for chest pain and leg swelling. Negative for palpitations.  Gastrointestinal: Positive for nausea and abdominal pain. Negative for vomiting and diarrhea.  Genitourinary: Negative for dysuria and flank pain.  Musculoskeletal: Negative for back pain and neck pain.  Skin: Negative for rash.  Allergic/Immunologic: Negative.   Neurological: Positive for dizziness, light-headedness and numbness. Negative for syncope and weakness.  Psychiatric/Behavioral: Negative for confusion.      Allergies  Other; Benadryl; Latex; and Sulfa antibiotics  Home Medications   Prior to Admission medications   Medication Sig Start Date End Date Taking? Authorizing Provider  amoxicillin-clavulanate (AUGMENTIN) 875-125 MG per tablet Take 1 tablet by mouth 2 (two) times daily. 09/07/14 09/21/14  Janith Lima, MD  nortriptyline (PAMELOR) 50 MG  capsule Take 50 mg by mouth at bedtime. 07/08/14   Historical Provider, MD  sertraline (ZOLOFT) 100 MG tablet Take 100 mg by mouth daily. Takes 2 in am    Historical Provider, MD   BP 129/94 mmHg  Pulse 100  Temp(Src) 98.3 F (36.8 C) (Oral)  Resp 20  Wt 208 lb 2 oz (94.405 kg)  SpO2 100% Physical Exam  Constitutional: She is oriented to person, place, and time. She appears well-developed and well-nourished. No distress.  Anxious appearing female speaking very fast and tangential  HENT:  Head: Normocephalic and atraumatic.  Mouth/Throat: Uvula is midline and mucous membranes are  normal.  Eyes: Conjunctivae and EOM are normal. Pupils are equal, round, and reactive to light.  Neck: Normal range of motion. Neck supple. No tracheal tenderness present. No tracheal deviation present. No thyroid mass and no thyromegaly present.  Cardiovascular: Regular rhythm and normal heart sounds.  Tachycardia present.   Pulses:      Radial pulses are 2+ on the right side, and 2+ on the left side.  Pulmonary/Chest: Effort normal and breath sounds normal. No accessory muscle usage or stridor. No respiratory distress. She has no decreased breath sounds.  Abdominal: Soft. Bowel sounds are normal. There is no tenderness.  Musculoskeletal: Normal range of motion.  Neurological: She is alert and oriented to person, place, and time. She has normal strength and normal reflexes. She displays normal reflexes. No cranial nerve deficit or sensory deficit. She displays a negative Romberg sign.  Normal finger to nose bilaterally.  Rapid alternating movements intact bilaterally.  Normal heal to shin bilaterally.   No pronator drift bilaterally.   Subjective numbness of left face and left arm.   Skin: Skin is warm and dry. She is not diaphoretic.  Psychiatric: Judgment and thought content normal. Her mood appears anxious. Her affect is labile. Her speech is rapid and/or pressured. She is agitated. Cognition and memory are normal. She exhibits a depressed mood.    ED Course  Procedures (including critical care time) Labs Review Labs Reviewed  CBC WITH DIFFERENTIAL/PLATELET - Abnormal; Notable for the following:    MCV 100.5 (*)    MCH 34.4 (*)    Eosinophils Relative 7 (*)    All other components within normal limits  BASIC METABOLIC PANEL  BRAIN NATRIURETIC PEPTIDE  TROPONIN I    Imaging Review Dg Chest 2 View  09/18/2014   CLINICAL DATA:  Chest pain, headache, nausea and diarrhea for 2 hr.  EXAM: CHEST  2 VIEW  COMPARISON:  06/04/2013  FINDINGS: The heart size and mediastinal contours are  within normal limits. Both lungs are clear. The visualized skeletal structures are unremarkable.  IMPRESSION: Normal chest x-ray.   Electronically Signed   By: Marijo Sanes M.D.   On: 09/18/2014 21:00     EKG Interpretation None      MDM   Final diagnoses:  None    On initial evaluation patient mildly tachycardic but otherwise hemodynamically stable. She was continued to have mild chest pain. EKG showing no acute ischemic changes arrhythmia or arrhythomgenic potential. Chest x-ray with no acute cardiopulmonary findings. Troponin negative. Bilateral radial pulses equal. Doubt ACS, dissection, pneumonia, pneumothorax at this time. Patient does have history of DVT in the past but no hypoxia at this time and chest pain not characteristic of PE.  Patient continues to have mild left numbness of her face and arm as well as swelling sensation in her neck. Airway evaluation with no stridor  posturing or wheezing. Full range of motion doubt deep neck infection or acute airway involvement. Further discussion with friend at bedside and found pt is an alcoholic and acutely intoxicated and has been off her recent anxiety medications. No focal neurologic deficits on exam and only subjective numbness.  Dobut TIA or stroke at this time.  LFTs elevated and consistent with chronic alcohol use.  Feel that a pan positive review of systems likely associated with anxiety at this time. CT head negative. Alcohol found to be 390. Alcohol use was discussed in depth the alcohol withdrawal symptoms with the patient and prescribe Librium taper.  Friend at bedside he reported she would take the patient home and watch over her.  If performed, labs, EKGs, and imaging were reviewed/interpreted by myself and my attending and incorporated into medical decision making.  Discussed pertinent finding with patient or caregiver prior to discharge with no further questions.  Immediate return precautions given and pt or caregiver reports  understanding.  Pt care supervised by my attending Dr. Berline Chough, MD PGY-2  Emergency Medicine     Geronimo Boot, MD 09/19/14 1244  Carmin Muskrat, MD 09/21/14 0010

## 2014-11-27 ENCOUNTER — Inpatient Hospital Stay (HOSPITAL_COMMUNITY)
Admission: EM | Admit: 2014-11-27 | Discharge: 2014-12-11 | DRG: 082 | Disposition: E | Payer: BLUE CROSS/BLUE SHIELD | Attending: General Surgery | Admitting: General Surgery

## 2014-11-27 ENCOUNTER — Emergency Department (HOSPITAL_COMMUNITY): Payer: BLUE CROSS/BLUE SHIELD

## 2014-11-27 ENCOUNTER — Encounter (HOSPITAL_COMMUNITY): Payer: Self-pay

## 2014-11-27 DIAGNOSIS — I10 Essential (primary) hypertension: Secondary | ICD-10-CM | POA: Diagnosis not present

## 2014-11-27 DIAGNOSIS — G931 Anoxic brain damage, not elsewhere classified: Secondary | ICD-10-CM | POA: Diagnosis present

## 2014-11-27 DIAGNOSIS — Z882 Allergy status to sulfonamides status: Secondary | ICD-10-CM

## 2014-11-27 DIAGNOSIS — Z91040 Latex allergy status: Secondary | ICD-10-CM

## 2014-11-27 DIAGNOSIS — E872 Acidosis: Secondary | ICD-10-CM | POA: Diagnosis not present

## 2014-11-27 DIAGNOSIS — R7981 Abnormal blood-gas level: Secondary | ICD-10-CM

## 2014-11-27 DIAGNOSIS — I609 Nontraumatic subarachnoid hemorrhage, unspecified: Secondary | ICD-10-CM

## 2014-11-27 DIAGNOSIS — R402112 Coma scale, eyes open, never, at arrival to emergency department: Secondary | ICD-10-CM | POA: Diagnosis present

## 2014-11-27 DIAGNOSIS — S066XAA Traumatic subarachnoid hemorrhage with loss of consciousness status unknown, initial encounter: Secondary | ICD-10-CM | POA: Diagnosis present

## 2014-11-27 DIAGNOSIS — R402312 Coma scale, best motor response, none, at arrival to emergency department: Secondary | ICD-10-CM | POA: Diagnosis present

## 2014-11-27 DIAGNOSIS — J9601 Acute respiratory failure with hypoxia: Secondary | ICD-10-CM

## 2014-11-27 DIAGNOSIS — D696 Thrombocytopenia, unspecified: Secondary | ICD-10-CM | POA: Diagnosis present

## 2014-11-27 DIAGNOSIS — S2220XA Unspecified fracture of sternum, initial encounter for closed fracture: Secondary | ICD-10-CM | POA: Diagnosis present

## 2014-11-27 DIAGNOSIS — F10129 Alcohol abuse with intoxication, unspecified: Secondary | ICD-10-CM | POA: Diagnosis present

## 2014-11-27 DIAGNOSIS — S066X9A Traumatic subarachnoid hemorrhage with loss of consciousness of unspecified duration, initial encounter: Secondary | ICD-10-CM | POA: Diagnosis present

## 2014-11-27 DIAGNOSIS — R000 Tachycardia, unspecified: Secondary | ICD-10-CM | POA: Diagnosis present

## 2014-11-27 DIAGNOSIS — I469 Cardiac arrest, cause unspecified: Secondary | ICD-10-CM | POA: Diagnosis not present

## 2014-11-27 DIAGNOSIS — S301XXA Contusion of abdominal wall, initial encounter: Secondary | ICD-10-CM | POA: Diagnosis present

## 2014-11-27 DIAGNOSIS — S8002XA Contusion of left knee, initial encounter: Secondary | ICD-10-CM | POA: Diagnosis present

## 2014-11-27 DIAGNOSIS — E876 Hypokalemia: Secondary | ICD-10-CM | POA: Diagnosis present

## 2014-11-27 DIAGNOSIS — S129XXA Fracture of neck, unspecified, initial encounter: Secondary | ICD-10-CM

## 2014-11-27 DIAGNOSIS — S12100A Unspecified displaced fracture of second cervical vertebra, initial encounter for closed fracture: Secondary | ICD-10-CM | POA: Diagnosis present

## 2014-11-27 DIAGNOSIS — R402212 Coma scale, best verbal response, none, at arrival to emergency department: Secondary | ICD-10-CM | POA: Diagnosis present

## 2014-11-27 DIAGNOSIS — Z888 Allergy status to other drugs, medicaments and biological substances status: Secondary | ICD-10-CM

## 2014-11-27 DIAGNOSIS — Y92410 Unspecified street and highway as the place of occurrence of the external cause: Secondary | ICD-10-CM

## 2014-11-27 DIAGNOSIS — T1490XA Injury, unspecified, initial encounter: Secondary | ICD-10-CM

## 2014-11-27 DIAGNOSIS — D689 Coagulation defect, unspecified: Secondary | ICD-10-CM | POA: Diagnosis present

## 2014-11-27 DIAGNOSIS — J9600 Acute respiratory failure, unspecified whether with hypoxia or hypercapnia: Secondary | ICD-10-CM | POA: Diagnosis present

## 2014-11-27 DIAGNOSIS — G935 Compression of brain: Secondary | ICD-10-CM | POA: Diagnosis present

## 2014-11-27 DIAGNOSIS — Z6841 Body Mass Index (BMI) 40.0 and over, adult: Secondary | ICD-10-CM

## 2014-11-27 DIAGNOSIS — J9690 Respiratory failure, unspecified, unspecified whether with hypoxia or hypercapnia: Secondary | ICD-10-CM | POA: Diagnosis present

## 2014-11-27 DIAGNOSIS — R579 Shock, unspecified: Secondary | ICD-10-CM | POA: Diagnosis present

## 2014-11-27 DIAGNOSIS — Z66 Do not resuscitate: Secondary | ICD-10-CM | POA: Diagnosis present

## 2014-11-27 DIAGNOSIS — S8001XA Contusion of right knee, initial encounter: Secondary | ICD-10-CM | POA: Diagnosis present

## 2014-11-27 LAB — I-STAT CHEM 8, ED
BUN: 5 mg/dL — AB (ref 6–20)
CALCIUM ION: 0.97 mmol/L — AB (ref 1.12–1.23)
Chloride: 107 mmol/L (ref 101–111)
Creatinine, Ser: 1.6 mg/dL — ABNORMAL HIGH (ref 0.44–1.00)
GLUCOSE: 332 mg/dL — AB (ref 65–99)
HCT: 36 % (ref 36.0–46.0)
Hemoglobin: 12.2 g/dL (ref 12.0–15.0)
Potassium: 4.2 mmol/L (ref 3.5–5.1)
SODIUM: 142 mmol/L (ref 135–145)
TCO2: 12 mmol/L (ref 0–100)

## 2014-11-27 LAB — COMPREHENSIVE METABOLIC PANEL
ALBUMIN: 2.6 g/dL — AB (ref 3.5–5.0)
ALK PHOS: 129 U/L — AB (ref 38–126)
ALT: 101 U/L — AB (ref 14–54)
AST: 228 U/L — AB (ref 15–41)
Anion gap: 20 — ABNORMAL HIGH (ref 5–15)
BILIRUBIN TOTAL: 1.5 mg/dL — AB (ref 0.3–1.2)
BUN: 6 mg/dL (ref 6–20)
CALCIUM: 7.2 mg/dL — AB (ref 8.9–10.3)
CO2: 12 mmol/L — AB (ref 22–32)
Chloride: 104 mmol/L (ref 101–111)
Creatinine, Ser: 1.17 mg/dL — ABNORMAL HIGH (ref 0.44–1.00)
GFR calc Af Amer: 32 mL/min — ABNORMAL LOW (ref >60)
GFR calc non Af Amer: 28 mL/min — ABNORMAL LOW (ref >60)
GLUCOSE: 338 mg/dL — AB (ref 65–99)
Potassium: 5.1 mmol/L (ref 3.5–5.1)
Sodium: 136 mmol/L (ref 135–145)
TOTAL PROTEIN: 5.1 g/dL — AB (ref 6.5–8.1)

## 2014-11-27 LAB — CBC
HCT: 37.7 % (ref 36.0–46.0)
Hemoglobin: 11.7 g/dL — ABNORMAL LOW (ref 12.0–15.0)
MCH: 35 pg — AB (ref 26.0–34.0)
MCHC: 31 g/dL (ref 30.0–36.0)
MCV: 112.9 fL — ABNORMAL HIGH (ref 78.0–100.0)
Platelets: 183 10*3/uL (ref 150–400)
RBC: 3.34 MIL/uL — ABNORMAL LOW (ref 3.87–5.11)
RDW: 15.9 % — AB (ref 11.5–15.5)
WBC: 13.5 10*3/uL — ABNORMAL HIGH (ref 4.0–10.5)

## 2014-11-27 LAB — ETHANOL: ALCOHOL ETHYL (B): 395 mg/dL — AB (ref <5)

## 2014-11-27 LAB — URINE MICROSCOPIC-ADD ON

## 2014-11-27 LAB — URINALYSIS, ROUTINE W REFLEX MICROSCOPIC
Bilirubin Urine: NEGATIVE
Glucose, UA: NEGATIVE mg/dL
Hgb urine dipstick: NEGATIVE
Ketones, ur: NEGATIVE mg/dL
NITRITE: NEGATIVE
PROTEIN: NEGATIVE mg/dL
SPECIFIC GRAVITY, URINE: 1.012 (ref 1.005–1.030)
UROBILINOGEN UA: 0.2 mg/dL (ref 0.0–1.0)
pH: 5.5 (ref 5.0–8.0)

## 2014-11-27 MED ORDER — SODIUM CHLORIDE 0.9 % IV SOLN
10.0000 mL/h | Freq: Once | INTRAVENOUS | Status: DC
Start: 2014-11-27 — End: 2014-12-01

## 2014-11-27 MED ORDER — IOHEXOL 300 MG/ML  SOLN
100.0000 mL | Freq: Once | INTRAMUSCULAR | Status: AC | PRN
  Administered 2014-11-27: 100 mL via INTRAVENOUS

## 2014-11-27 MED ORDER — PROPOFOL 1000 MG/100ML IV EMUL
INTRAVENOUS | Status: AC
  Administered 2014-11-27: 10 ug/kg/min via INTRAVENOUS
  Filled 2014-11-27: qty 100

## 2014-11-27 MED ORDER — SODIUM CHLORIDE 0.9 % IV SOLN
INTRAVENOUS | Status: AC | PRN
  Administered 2014-11-27 (×2): 1000 mL via INTRAVENOUS

## 2014-11-27 MED ORDER — EPINEPHRINE HCL 0.1 MG/ML IJ SOSY
PREFILLED_SYRINGE | INTRAMUSCULAR | Status: AC | PRN
  Administered 2014-11-27: 1 mg via INTRAVENOUS

## 2014-11-27 MED ORDER — PHENYLEPHRINE HCL 10 MG/ML IJ SOLN
0.0000 ug/min | Freq: Once | INTRAVENOUS | Status: AC
  Administered 2014-11-27: 50 ug/min via INTRAVENOUS
  Filled 2014-11-27: qty 1

## 2014-11-27 MED ORDER — PROPOFOL 1000 MG/100ML IV EMUL
5.0000 ug/kg/min | INTRAVENOUS | Status: DC
  Administered 2014-11-27: 10 ug/kg/min via INTRAVENOUS
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
## 2014-11-27 NOTE — Progress Notes (Signed)
Central Venous Catheter Insertion Procedure Note  Procedure: Insertion of Central Venous Catheter  Indications:  vascular access, trauma  Procedure Details  Informed consent was not obtained for the procedure due to emergency need for vascular access and severe hypotension  Maximum sterile technique was used including antiseptics, gloves, hand hygiene and sheet.  Under sterile conditions the skin above the on the right femoral vein was prepped with betadine and covered with a sterile drape.  An 18-gauge needle was used to find and then inserted into the vein. A guide wire was then passed easily through the catheter.  The catheter was then withdrawn. A 12 French triple-lumen was then inserted into the vessel over the guide wire. The catheter was sutured into place.  Findings: There were no changes to vital signs. Catheter was flushed with 20 cc NS. Patient did tolerate procedure well.  Molly Fitzgerald. Redmond Pulling, MD, FACS General, Bariatric, & Minimally Invasive Surgery [facility], [place]

## 2014-11-27 NOTE — ED Notes (Signed)
Family at bedside. Dr. Redmond Pulling states pt to get CTA before pt is taken upstairs.
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
## 2014-11-28 ENCOUNTER — Inpatient Hospital Stay (HOSPITAL_COMMUNITY): Payer: BLUE CROSS/BLUE SHIELD

## 2014-11-28 ENCOUNTER — Emergency Department (HOSPITAL_COMMUNITY): Payer: BLUE CROSS/BLUE SHIELD

## 2014-11-28 ENCOUNTER — Encounter (HOSPITAL_COMMUNITY): Payer: Self-pay | Admitting: Emergency Medicine

## 2014-11-28 DIAGNOSIS — R579 Shock, unspecified: Secondary | ICD-10-CM | POA: Diagnosis present

## 2014-11-28 DIAGNOSIS — R000 Tachycardia, unspecified: Secondary | ICD-10-CM | POA: Diagnosis present

## 2014-11-28 DIAGNOSIS — Z6841 Body Mass Index (BMI) 40.0 and over, adult: Secondary | ICD-10-CM | POA: Diagnosis not present

## 2014-11-28 DIAGNOSIS — Z888 Allergy status to other drugs, medicaments and biological substances status: Secondary | ICD-10-CM | POA: Diagnosis not present

## 2014-11-28 DIAGNOSIS — I10 Essential (primary) hypertension: Secondary | ICD-10-CM | POA: Diagnosis not present

## 2014-11-28 DIAGNOSIS — S8001XA Contusion of right knee, initial encounter: Secondary | ICD-10-CM | POA: Diagnosis present

## 2014-11-28 DIAGNOSIS — Z882 Allergy status to sulfonamides status: Secondary | ICD-10-CM | POA: Diagnosis not present

## 2014-11-28 DIAGNOSIS — F10129 Alcohol abuse with intoxication, unspecified: Secondary | ICD-10-CM | POA: Diagnosis present

## 2014-11-28 DIAGNOSIS — Z91040 Latex allergy status: Secondary | ICD-10-CM | POA: Diagnosis not present

## 2014-11-28 DIAGNOSIS — G931 Anoxic brain damage, not elsewhere classified: Secondary | ICD-10-CM | POA: Diagnosis present

## 2014-11-28 DIAGNOSIS — S8002XA Contusion of left knee, initial encounter: Secondary | ICD-10-CM | POA: Diagnosis present

## 2014-11-28 DIAGNOSIS — E872 Acidosis: Secondary | ICD-10-CM | POA: Diagnosis not present

## 2014-11-28 DIAGNOSIS — Y92410 Unspecified street and highway as the place of occurrence of the external cause: Secondary | ICD-10-CM | POA: Diagnosis not present

## 2014-11-28 DIAGNOSIS — R402112 Coma scale, eyes open, never, at arrival to emergency department: Secondary | ICD-10-CM | POA: Diagnosis present

## 2014-11-28 DIAGNOSIS — I469 Cardiac arrest, cause unspecified: Secondary | ICD-10-CM | POA: Diagnosis present

## 2014-11-28 DIAGNOSIS — G935 Compression of brain: Secondary | ICD-10-CM | POA: Diagnosis present

## 2014-11-28 DIAGNOSIS — E876 Hypokalemia: Secondary | ICD-10-CM | POA: Diagnosis present

## 2014-11-28 DIAGNOSIS — R402312 Coma scale, best motor response, none, at arrival to emergency department: Secondary | ICD-10-CM | POA: Diagnosis present

## 2014-11-28 DIAGNOSIS — D689 Coagulation defect, unspecified: Secondary | ICD-10-CM | POA: Diagnosis present

## 2014-11-28 DIAGNOSIS — R402212 Coma scale, best verbal response, none, at arrival to emergency department: Secondary | ICD-10-CM | POA: Diagnosis present

## 2014-11-28 DIAGNOSIS — S12100A Unspecified displaced fracture of second cervical vertebra, initial encounter for closed fracture: Secondary | ICD-10-CM | POA: Diagnosis present

## 2014-11-28 DIAGNOSIS — S066X9A Traumatic subarachnoid hemorrhage with loss of consciousness of unspecified duration, initial encounter: Secondary | ICD-10-CM | POA: Diagnosis present

## 2014-11-28 DIAGNOSIS — D696 Thrombocytopenia, unspecified: Secondary | ICD-10-CM | POA: Diagnosis present

## 2014-11-28 DIAGNOSIS — Z66 Do not resuscitate: Secondary | ICD-10-CM | POA: Diagnosis present

## 2014-11-28 DIAGNOSIS — S2220XA Unspecified fracture of sternum, initial encounter for closed fracture: Secondary | ICD-10-CM | POA: Diagnosis present

## 2014-11-28 DIAGNOSIS — S301XXA Contusion of abdominal wall, initial encounter: Secondary | ICD-10-CM | POA: Diagnosis present

## 2014-11-28 DIAGNOSIS — J9690 Respiratory failure, unspecified, unspecified whether with hypoxia or hypercapnia: Secondary | ICD-10-CM | POA: Diagnosis present

## 2014-11-28 LAB — HEPATIC FUNCTION PANEL
ALBUMIN: 2.4 g/dL — AB (ref 3.5–5.0)
ALT: 195 U/L — ABNORMAL HIGH (ref 14–54)
AST: 751 U/L — AB (ref 15–41)
Alkaline Phosphatase: 125 U/L (ref 38–126)
BILIRUBIN TOTAL: 0.8 mg/dL (ref 0.3–1.2)
Bilirubin, Direct: 0.5 mg/dL (ref 0.1–0.5)
Indirect Bilirubin: 0.3 mg/dL (ref 0.3–0.9)
TOTAL PROTEIN: 4.7 g/dL — AB (ref 6.5–8.1)

## 2014-11-28 LAB — TRIGLYCERIDES: TRIGLYCERIDES: 294 mg/dL — AB (ref <150)

## 2014-11-28 LAB — BLOOD GAS, ARTERIAL
Acid-base deficit: 15 mmol/L — ABNORMAL HIGH (ref 0.0–2.0)
Bicarbonate: 11.5 mEq/L — ABNORMAL LOW (ref 20.0–24.0)
DRAWN BY: 41308
FIO2: 100
LHR: 16 {breaths}/min
MECHVT: 460 mL
O2 Saturation: 98.9 %
PATIENT TEMPERATURE: 96.8
PCO2 ART: 29.4 mmHg — AB (ref 35.0–45.0)
PEEP: 5 cmH2O
PO2 ART: 218 mmHg — AB (ref 80.0–100.0)
TCO2: 12.4 mmol/L (ref 0–100)
pH, Arterial: 7.209 — ABNORMAL LOW (ref 7.350–7.450)

## 2014-11-28 LAB — PREPARE FRESH FROZEN PLASMA
UNIT DIVISION: 0
Unit division: 0

## 2014-11-28 LAB — RAPID URINE DRUG SCREEN, HOSP PERFORMED
Amphetamines: NOT DETECTED
BARBITURATES: NOT DETECTED
BENZODIAZEPINES: POSITIVE — AB
Cocaine: NOT DETECTED
Opiates: NOT DETECTED
TETRAHYDROCANNABINOL: NOT DETECTED

## 2014-11-28 LAB — COMPREHENSIVE METABOLIC PANEL
ALBUMIN: 2.5 g/dL — AB (ref 3.5–5.0)
ALK PHOS: 133 U/L — AB (ref 38–126)
ALT: 195 U/L — AB (ref 14–54)
AST: 769 U/L — AB (ref 15–41)
Anion gap: 14 (ref 5–15)
BILIRUBIN TOTAL: 1.1 mg/dL (ref 0.3–1.2)
BUN: 6 mg/dL (ref 6–20)
CALCIUM: 6.6 mg/dL — AB (ref 8.9–10.3)
CO2: 15 mmol/L — ABNORMAL LOW (ref 22–32)
CREATININE: 0.85 mg/dL (ref 0.44–1.00)
Chloride: 116 mmol/L — ABNORMAL HIGH (ref 101–111)
GFR calc Af Amer: >60 mL/min (ref >60)
GFR calc non Af Amer: >60 mL/min (ref >60)
GLUCOSE: 236 mg/dL — AB (ref 65–99)
POTASSIUM: 3.1 mmol/L — AB (ref 3.5–5.1)
Sodium: 145 mmol/L (ref 135–145)
TOTAL PROTEIN: 5.1 g/dL — AB (ref 6.5–8.1)

## 2014-11-28 LAB — PROTIME-INR
INR: 1.57 — ABNORMAL HIGH (ref 0.00–1.49)
INR: 1.8 — AB (ref 0.00–1.49)
PROTHROMBIN TIME: 18.8 s — AB (ref 11.6–15.2)
PROTHROMBIN TIME: 20.9 s — AB (ref 11.6–15.2)

## 2014-11-28 LAB — POCT I-STAT 3, ART BLOOD GAS (G3+)
Acid-base deficit: 8 mmol/L — ABNORMAL HIGH (ref 0.0–2.0)
Bicarbonate: 15.9 meq/L — ABNORMAL LOW (ref 20.0–24.0)
O2 SAT: 100 %
PCO2 ART: 30.2 mmHg — AB (ref 35.0–45.0)
PH ART: 7.339 — AB (ref 7.350–7.450)
PO2 ART: 236 mmHg — AB (ref 80.0–100.0)
Patient temperature: 39.1
TCO2: 17 mmol/L (ref 0–100)

## 2014-11-28 LAB — CBC
HEMATOCRIT: 35.4 % — AB (ref 36.0–46.0)
Hemoglobin: 11.7 g/dL — ABNORMAL LOW (ref 12.0–15.0)
MCH: 34.1 pg — ABNORMAL HIGH (ref 26.0–34.0)
MCHC: 33.1 g/dL (ref 30.0–36.0)
MCV: 103.2 fL — ABNORMAL HIGH (ref 78.0–100.0)
PLATELETS: 137 10*3/uL — AB (ref 150–400)
RBC: 3.43 MIL/uL — ABNORMAL LOW (ref 3.87–5.11)
RDW: 18.6 % — AB (ref 11.5–15.5)
WBC: 15.1 10*3/uL — AB (ref 4.0–10.5)

## 2014-11-28 LAB — MRSA PCR SCREENING: MRSA by PCR: NEGATIVE

## 2014-11-28 LAB — ABO/RH: ABO/RH(D): A POS

## 2014-11-28 LAB — APTT: APTT: 33 s (ref 24–37)

## 2014-11-28 MED ORDER — IOHEXOL 350 MG/ML SOLN
50.0000 mL | Freq: Once | INTRAVENOUS | Status: AC | PRN
  Administered 2014-11-28: 50 mL via INTRAVENOUS

## 2014-11-28 MED ORDER — FOLIC ACID 1 MG PO TABS
1.0000 mg | ORAL_TABLET | Freq: Every day | ORAL | Status: DC
  Administered 2014-11-28 – 2014-11-29 (×2): 1 mg via ORAL
  Filled 2014-11-28 (×2): qty 1

## 2014-11-28 MED ORDER — PROPOFOL 1000 MG/100ML IV EMUL: 5.0000 ug/kg/min | INTRAVENOUS | Status: DC

## 2014-11-28 MED ORDER — PANTOPRAZOLE SODIUM 40 MG PO TBEC: 40.0000 mg | DELAYED_RELEASE_TABLET | Freq: Every day | ORAL | Status: DC

## 2014-11-28 MED ORDER — PHENYLEPHRINE HCL 10 MG/ML IJ SOLN
0.0000 ug/min | INTRAVENOUS | Status: DC
  Administered 2014-11-28: 30 ug/min via INTRAVENOUS
  Administered 2014-11-30: 120 ug/min via INTRAVENOUS
  Administered 2014-11-30: 20 ug/min via INTRAVENOUS
  Filled 2014-11-28 (×4): qty 1

## 2014-11-28 MED ORDER — SODIUM BICARBONATE 8.4 % IV SOLN: 500.0000 meq | Freq: Once | INTRAVENOUS | Status: DC

## 2014-11-28 MED ORDER — SODIUM CHLORIDE 0.9 % IJ SOLN
10.0000 mL | Freq: Two times a day (BID) | INTRAMUSCULAR | Status: DC
  Administered 2014-11-28 – 2014-11-29 (×3): 10 mL via INTRAVENOUS

## 2014-11-28 MED ORDER — SODIUM CHLORIDE 0.9 % IV SOLN
25.0000 ug/h | INTRAVENOUS | Status: DC
  Administered 2014-11-28: 200 ug/h via INTRAVENOUS
  Administered 2014-11-28: 50 ug/h via INTRAVENOUS
  Administered 2014-11-29: 400 ug/h via INTRAVENOUS
  Filled 2014-11-28 (×3): qty 50

## 2014-11-28 MED ORDER — SODIUM BICARBONATE 8.4 % IV SOLN
100.0000 meq | Freq: Once | INTRAVENOUS | Status: AC
  Administered 2014-11-28: 100 meq via INTRAVENOUS

## 2014-11-28 MED ORDER — ANTISEPTIC ORAL RINSE SOLUTION (CORINZ)
7.0000 mL | Freq: Four times a day (QID) | OROMUCOSAL | Status: DC
  Administered 2014-11-28 – 2014-11-30 (×8): 7 mL via OROMUCOSAL

## 2014-11-28 MED ORDER — ONDANSETRON HCL 4 MG/2ML IJ SOLN: 4.0000 mg | Freq: Four times a day (QID) | INTRAMUSCULAR | Status: DC | PRN

## 2014-11-28 MED ORDER — SODIUM CHLORIDE 0.9 % IJ SOLN
10.0000 mL | INTRAMUSCULAR | Status: DC | PRN
  Administered 2014-11-28: 10 mL via INTRAVENOUS
  Filled 2014-11-28: qty 10

## 2014-11-28 MED ORDER — CHLORHEXIDINE GLUCONATE 0.12% ORAL RINSE (MEDLINE KIT)
15.0000 mL | Freq: Two times a day (BID) | OROMUCOSAL | Status: DC
  Administered 2014-11-28 – 2014-11-30 (×6): 15 mL via OROMUCOSAL

## 2014-11-28 MED ORDER — METOPROLOL TARTRATE 1 MG/ML IV SOLN
5.0000 mg | INTRAVENOUS | Status: DC
  Administered 2014-11-28 – 2014-11-30 (×10): 5 mg via INTRAVENOUS
  Filled 2014-11-28 (×10): qty 5

## 2014-11-28 MED ORDER — SODIUM CHLORIDE 0.9 % IV SOLN
INTRAVENOUS | Status: DC
  Administered 2014-11-28 – 2014-11-30 (×4): via INTRAVENOUS

## 2014-11-28 MED ORDER — VITAMIN B-1 100 MG PO TABS
100.0000 mg | ORAL_TABLET | Freq: Every day | ORAL | Status: DC
  Administered 2014-11-28 – 2014-11-29 (×2): 100 mg via ORAL
  Filled 2014-11-28 (×2): qty 1

## 2014-11-28 MED ORDER — ACETAMINOPHEN 160 MG/5ML PO SOLN
650.0000 mg | Freq: Four times a day (QID) | ORAL | Status: DC | PRN
Start: 2014-11-28 — End: 2014-12-01
  Administered 2014-11-28 (×2): 650 mg
  Filled 2014-11-28 (×2): qty 20.3

## 2014-11-28 MED ORDER — ADULT MULTIVITAMIN W/MINERALS CH
1.0000 | ORAL_TABLET | Freq: Every day | ORAL | Status: DC
  Administered 2014-11-28 – 2014-11-29 (×2): 1 via ORAL
  Filled 2014-11-28 (×2): qty 1

## 2014-11-28 MED ORDER — ONDANSETRON HCL 4 MG PO TABS: 4.0000 mg | ORAL_TABLET | Freq: Four times a day (QID) | ORAL | Status: DC | PRN

## 2014-11-28 MED ORDER — FENTANYL CITRATE (PF) 100 MCG/2ML IJ SOLN: 100.0000 ug | INTRAMUSCULAR | Status: DC | PRN

## 2014-11-28 MED ORDER — PROPOFOL 1000 MG/100ML IV EMUL
0.0000 ug/kg/min | INTRAVENOUS | Status: DC
  Administered 2014-11-28: 25 ug/kg/min via INTRAVENOUS
  Administered 2014-11-28: 15 ug/kg/min via INTRAVENOUS
  Administered 2014-11-28: 40 ug/kg/min via INTRAVENOUS
  Administered 2014-11-28: 20 ug/kg/min via INTRAVENOUS
  Administered 2014-11-29: 40 ug/kg/min via INTRAVENOUS
  Administered 2014-11-29: 30 ug/kg/min via INTRAVENOUS
  Filled 2014-11-28 (×6): qty 100

## 2014-11-28 MED ORDER — POTASSIUM CHLORIDE 20 MEQ/15ML (10%) PO SOLN
40.0000 meq | Freq: Two times a day (BID) | ORAL | Status: AC
  Administered 2014-11-28 (×2): 40 meq via ORAL
  Filled 2014-11-28 (×2): qty 30

## 2014-11-28 MED ORDER — FENTANYL CITRATE (PF) 100 MCG/2ML IJ SOLN
100.0000 ug | INTRAMUSCULAR | Status: DC | PRN
  Administered 2014-11-28 (×2): 100 ug via INTRAVENOUS
  Filled 2014-11-28 (×2): qty 2

## 2014-11-28 MED ORDER — SODIUM BICARBONATE 8.4 % IV SOLN
INTRAVENOUS | Status: AC
  Administered 2014-11-28: 100 meq via INTRAVENOUS
  Filled 2014-11-28: qty 150

## 2014-11-28 MED ORDER — PANTOPRAZOLE SODIUM 40 MG IV SOLR
40.0000 mg | Freq: Every day | INTRAVENOUS | Status: DC
  Administered 2014-11-28 – 2014-11-29 (×2): 40 mg via INTRAVENOUS
  Filled 2014-11-28 (×2): qty 40

## 2014-11-28 NOTE — ED Notes (Signed)
First unit of O-neg blood finished at 2222.

## 2014-11-28 NOTE — ED Notes (Signed)
1st Unit of plasma [id number] started. Pt being taken to CT.
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
## 2014-11-28 NOTE — ED Notes (Signed)
Pt being transported to 3M09.

## 2014-11-28 NOTE — Progress Notes (Signed)
Greer Pickerel MD (Trauma) and Jeb Levering MD (Radiologist) agreed to give patient 13mL reduced dose omnipaque 350 for CTA neck after patient received 111mL omnipaque 300 as a trauma 1. Patients creatinine came back 1.60 GFR 28 after trauma imaging.

## 2014-11-28 NOTE — ED Notes (Signed)
Dr. Redmond Pulling on phone speaking with Dr. Ellene Route regarding patient.

## 2014-11-28 NOTE — ED Notes (Signed)
1st unit of O-neg blood given wide open [id number] at [id number]. Hung By Delmar Landau, charge RN.

## 2014-11-28 NOTE — Code Documentation (Signed)
PULSE CHECK

## 2014-11-28 NOTE — ED Notes (Signed)
Pt being taken to CT 3 and then upstairs to 3M09.
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
## 2014-11-28 NOTE — ED Notes (Signed)
Second unit of blood infusion complete.
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
## 2014-11-28 NOTE — ED Notes (Signed)
Second unit of O-neg blood  [id number] administered via R femoral central line, adm by Delmar Landau, Charge RN

## 2014-11-28 NOTE — Progress Notes (Signed)
Patient ID: Molly Fitzgerald, female   DOB: 11/29/1965, 49 y.o.   MRN: [id number] Vital signs show blood pressure in 160s heart rate 130s patient has some fever to 101 Spontaneous eye opening is noted. 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

## 2014-11-28 NOTE — Progress Notes (Signed)
RT Note: Pt transported to MRI with no complications. RT will continue to monitor. 

## 2014-11-28 NOTE — Progress Notes (Signed)
Bite blocked placed on right side of mouth due to patient clamping down on tube and tongue with teeth. RN aware. Will continue to monitor.

## 2014-11-28 NOTE — Progress Notes (Signed)
Utilization Review Completed.Dowell, Deborah T9/18/2016  

## 2014-11-28 NOTE — Progress Notes (Signed)
ABG results called to Beacher May, RN at [phone].

## 2014-11-28 NOTE — ED Notes (Signed)
Aspen collar applied by this RN and Delmar Landau, RN while maintaining stabilization of C-spine.

## 2014-11-28 NOTE — ED Notes (Signed)
1st unit of Plasma complete.

## 2014-11-29 ENCOUNTER — Inpatient Hospital Stay (HOSPITAL_COMMUNITY): Payer: BLUE CROSS/BLUE SHIELD

## 2014-11-29 LAB — CBC
HCT: 34 % — ABNORMAL LOW (ref 36.0–46.0)
HEMATOCRIT: 33.6 % — AB (ref 36.0–46.0)
HEMOGLOBIN: 10.5 g/dL — AB (ref 12.0–15.0)
Hemoglobin: 11.2 g/dL — ABNORMAL LOW (ref 12.0–15.0)
MCH: 34.3 pg — AB (ref 26.0–34.0)
MCH: 33.1 pg (ref 26.0–34.0)
MCHC: 32.9 g/dL (ref 30.0–36.0)
MCHC: 31.3 g/dL (ref 30.0–36.0)
MCV: 104 fL — AB (ref 78.0–100.0)
MCV: 106 fL — ABNORMAL HIGH (ref 78.0–100.0)
PLATELETS: 64 10*3/uL — AB (ref 150–400)
Platelets: 54 10*3/uL — ABNORMAL LOW (ref 150–400)
RBC: 3.27 MIL/uL — ABNORMAL LOW (ref 3.87–5.11)
RBC: 3.17 MIL/uL — AB (ref 3.87–5.11)
RDW: 19.4 % — ABNORMAL HIGH (ref 11.5–15.5)
RDW: 18.9 % — ABNORMAL HIGH (ref 11.5–15.5)
WBC: 11.4 10*3/uL — ABNORMAL HIGH (ref 4.0–10.5)
WBC: 7.2 10*3/uL (ref 4.0–10.5)

## 2014-11-29 LAB — BASIC METABOLIC PANEL
ANION GAP: 8 (ref 5–15)
Anion gap: 9 (ref 5–15)
BUN: 8 mg/dL (ref 6–20)
BUN: 12 mg/dL (ref 6–20)
CALCIUM: 6.8 mg/dL — AB (ref 8.9–10.3)
CHLORIDE: 119 mmol/L — AB (ref 101–111)
CO2: 19 mmol/L — ABNORMAL LOW (ref 22–32)
CO2: 17 mmol/L — AB (ref 22–32)
CREATININE: 0.71 mg/dL (ref 0.44–1.00)
CREATININE: 0.7 mg/dL (ref 0.44–1.00)
Calcium: 5.8 mg/dL — CL (ref 8.9–10.3)
Chloride: 118 mmol/L — ABNORMAL HIGH (ref 101–111)
GFR calc Af Amer: >60 mL/min (ref >60)
GLUCOSE: 119 mg/dL — AB (ref 65–99)
GLUCOSE: 103 mg/dL — AB (ref 65–99)
Potassium: 3.8 mmol/L (ref 3.5–5.1)
Potassium: 3.1 mmol/L — ABNORMAL LOW (ref 3.5–5.1)
Sodium: 146 mmol/L — ABNORMAL HIGH (ref 135–145)
Sodium: 144 mmol/L (ref 135–145)

## 2014-11-29 LAB — BLOOD GAS, ARTERIAL
ACID-BASE DEFICIT: 6.4 mmol/L — AB (ref 0.0–2.0)
Acid-base deficit: 5.7 mmol/L — ABNORMAL HIGH (ref 0.0–2.0)
BICARBONATE: 17.8 meq/L — AB (ref 20.0–24.0)
Bicarbonate: 18.8 mEq/L — ABNORMAL LOW (ref 20.0–24.0)
DRAWN BY: 41308
Drawn by: 41308
FIO2: 0.4
FIO2: 0.6
MECHVT: 460 mL
O2 SAT: 98.7 %
O2 Saturation: 92.8 %
PATIENT TEMPERATURE: 98.6
PCO2 ART: 27.2 mmHg — AB (ref 35.0–45.0)
PEEP/CPAP: 5 cmH2O
PEEP: 5 cmH2O
PH ART: 7.431 (ref 7.350–7.450)
PH ART: 7.303 — AB (ref 7.350–7.450)
PO2 ART: 72.4 mmHg — AB (ref 80.0–100.0)
Patient temperature: 98.6
RATE: 16 resp/min
RATE: 14 resp/min
TCO2: 18.6 mmol/L (ref 0–100)
TCO2: 20 mmol/L (ref 0–100)
VT: 460 mL
pCO2 arterial: 39.2 mmHg (ref 35.0–45.0)
pO2, Arterial: 126 mmHg — ABNORMAL HIGH (ref 80.0–100.0)

## 2014-11-29 LAB — URINE MICROSCOPIC-ADD ON

## 2014-11-29 LAB — URINALYSIS, ROUTINE W REFLEX MICROSCOPIC
Glucose, UA: NEGATIVE mg/dL
Ketones, ur: NEGATIVE mg/dL
LEUKOCYTES UA: NEGATIVE
Nitrite: NEGATIVE
PROTEIN: 30 mg/dL — AB
Specific Gravity, Urine: 1.022 (ref 1.005–1.030)
UROBILINOGEN UA: 1 mg/dL (ref 0.0–1.0)
pH: 5 (ref 5.0–8.0)

## 2014-11-29 LAB — PROTIME-INR
INR: 1.7 — ABNORMAL HIGH (ref 0.00–1.49)
Prothrombin Time: 20 seconds — ABNORMAL HIGH (ref 11.6–15.2)

## 2014-11-29 LAB — BLOOD PRODUCT ORDER (VERBAL) VERIFICATION

## 2014-11-29 LAB — ABO/RH
ABO/RH(D): A POS
PT AG Type: POSITIVE

## 2014-11-29 MED ORDER — FUROSEMIDE 10 MG/ML IJ SOLN
10.0000 mg | Freq: Once | INTRAMUSCULAR | Status: AC
  Administered 2014-11-29: 10 mg via INTRAVENOUS
  Filled 2014-11-29: qty 2

## 2014-11-29 MED ORDER — HYDRALAZINE HCL 20 MG/ML IJ SOLN
20.0000 mg | INTRAMUSCULAR | Status: DC | PRN
  Administered 2014-11-29: 20 mg via INTRAVENOUS
  Filled 2014-11-29: qty 1

## 2014-11-29 NOTE — Progress Notes (Deleted)
Pt. positive for one minute cuff leak test. Done for Core.

## 2014-11-29 NOTE — Progress Notes (Signed)
Dr. Barry Dienes called due to pts decreased urine output and calcium of 5.8.  Lasix ordered. Will continue to monitor.
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
## 2014-11-29 NOTE — Clinical Documentation Improvement (Signed)
Trauma  Abnormal diagnostic findings (MRI scans, CT scans, tec.) are not coded and reported unless the physician indicates their clinical significance.  Possible Clinical Conditions:  Cerebral Edema, Cytoxic Edema, Vasogenic Edema, including suspected or known cause and associated condition(s).  Herniation, including type - uncal, transtentorial, or other type of herniation, including suspected or known cause and associated condition(s).  Other  Clinically Undetermined  Document any associated diagnoses/conditions.   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   Please exercise your independent, professional judgment when responding. A specific answer is not anticipated or expected.   Thank You,  [place] [phone]

## 2014-11-29 NOTE — Progress Notes (Signed)
Patient ID: Molly Fitzgerald, female   DOB: 27-Nov-1965, 49 y.o.   MRN: [id number] I reviewed the MRIs and MRAs that were performed yesterday. The vertebral and carotid arteries appear to be in continuity however there are widespread areas of anoxic injury in the cerebellum consistent with a prolonged anoxic episode. C2 shows stenosis but no intrinsic cord injury.  Neurologically the patient's exam remains very poor that is her pupils are 2 mm nonreactive there are no extraocular movements she does breathe above the ventilator but has no peripheral movement of the extremities. On one or 2 occasions weak cough has been noted but not in response to any particular stimulation.  Overall the patient's prognosis is poor. I understand that a conversation has been had with the family and they have agreed to withdrawal of aggressive measures.
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
## 2014-11-29 NOTE — Clinical Documentation Improvement (Signed)
Trauma  Can the diagnosis of Shock be further specified?   Shock, including Type:  Septic, Cardiogenic, Hyper/Hypoglycemic, Hypovolemic, Hemorrhagic, Neurogenic, Anaphylactic, Other type, including suspected or known cause and/or associated condition(s)  Other  Clinically Undetermined  Document any associated diagnoses/conditions.   Supporting Information: Patient with hypotensive shock per 9/18 progress notes.     (Hypotensive shock codes to unspecified shock).  Please exercise your independent, professional judgment when responding. A specific answer is not anticipated or expected.   Thank You,  [place] [phone]

## 2014-11-30 ENCOUNTER — Encounter (HOSPITAL_COMMUNITY): Payer: Self-pay | Admitting: Anesthesiology

## 2014-11-30 ENCOUNTER — Encounter (HOSPITAL_COMMUNITY): Admission: EM | Disposition: E | Payer: Self-pay | Source: Home / Self Care

## 2014-11-30 ENCOUNTER — Encounter (HOSPITAL_COMMUNITY)
Admission: EM | Admit: 2014-11-30 | Discharge: 2014-11-30 | Disposition: A | Discharge status: Home / Self Care | Payer: No Typology Code available for payment source | Source: Ambulatory Visit | Attending: General Surgery | Admitting: General Surgery

## 2014-11-30 DIAGNOSIS — Z529 Donor of unspecified organ or tissue: Secondary | ICD-10-CM | POA: Insufficient documentation

## 2014-11-30 HISTORY — PX: ORGAN PROCUREMENT: SHX5270

## 2014-11-30 LAB — URINALYSIS, ROUTINE W REFLEX MICROSCOPIC
Glucose, UA: 250 mg/dL — AB
Ketones, ur: NEGATIVE mg/dL
LEUKOCYTES UA: NEGATIVE
NITRITE: POSITIVE — AB
PH: 5.5 (ref 5.0–8.0)
Protein, ur: 30 mg/dL — AB
SPECIFIC GRAVITY, URINE: 1.03 (ref 1.005–1.030)
Urobilinogen, UA: 1 mg/dL (ref 0.0–1.0)

## 2014-11-30 LAB — URINE MICROSCOPIC-ADD ON

## 2014-11-30 LAB — URINE CULTURE
CULTURE: NO GROWTH
Special Requests: NORMAL

## 2014-11-30 SURGERY — SURGICAL PROCUREMENT, ORGAN
Anesthesia: General | Site: Abdomen

## 2014-11-30 MED ORDER — ATROPINE SULFATE 0.1 MG/ML IJ SOLN
INTRAMUSCULAR | Status: AC
  Filled 2014-11-30: qty 10

## 2014-11-30 MED ORDER — ATROPINE SULFATE 0.1 MG/ML IJ SOLN: 1.0000 mg | Freq: Once | INTRAMUSCULAR | Status: DC

## 2014-11-30 MED ORDER — SODIUM CHLORIDE 0.9 % IV BOLUS (SEPSIS)
1000.0000 mL | Freq: Once | INTRAVENOUS | Status: AC
Start: 2014-11-30 — End: 2014-11-30
  Administered 2014-11-30: 1000 mL via INTRAVENOUS

## 2014-11-30 MED ORDER — HEPARIN SODIUM (PORCINE) 1000 UNIT/ML IJ SOLN
10000.0000 [IU] | Freq: Once | INTRAMUSCULAR | Status: DC
  Filled 2014-11-30 (×2): qty 10

## 2014-11-30 MED ORDER — DOPAMINE-DEXTROSE 3.2-5 MG/ML-% IV SOLN
5.0000 ug/kg/min | INTRAVENOUS | Status: DC
  Administered 2014-11-30: 5 ug/kg/min via INTRAVENOUS

## 2014-11-30 MED ORDER — ATROPINE SULFATE 0.1 MG/ML IJ SOLN
0.5000 mg | Freq: Once | INTRAMUSCULAR | Status: AC
  Administered 2014-11-30: 0.5 mg via INTRAVENOUS

## 2014-11-30 MED ORDER — PIPERACILLIN-TAZOBACTAM 3.375 G IVPB
3.3750 g | INTRAVENOUS | Status: AC
  Administered 2014-11-30: 3.375 g via INTRAVENOUS
  Filled 2014-11-30 (×2): qty 50

## 2014-11-30 MED ORDER — POTASSIUM CHLORIDE 10 MEQ/50ML IV SOLN
10.0000 meq | INTRAVENOUS | Status: AC
  Administered 2014-11-30 (×3): 10 meq via INTRAVENOUS
  Filled 2014-11-30 (×3): qty 50

## 2014-11-30 MED ORDER — SODIUM CHLORIDE 0.9 % IV BOLUS (SEPSIS)
500.0000 mL | Freq: Once | INTRAVENOUS | Status: AC
  Administered 2014-11-30: 500 mL via INTRAVENOUS

## 2014-11-30 MED ORDER — ATROPINE SULFATE 1 MG/ML IJ SOLN
INTRAMUSCULAR | Status: AC
  Filled 2014-11-30: qty 1

## 2014-11-30 MED ORDER — FUROSEMIDE 10 MG/ML IJ SOLN: 20.0000 mg | Freq: Once | INTRAMUSCULAR | Status: DC

## 2014-11-30 MED ORDER — DOPAMINE-DEXTROSE 3.2-5 MG/ML-% IV SOLN
INTRAVENOUS | Status: AC
  Filled 2014-11-30: qty 250

## 2014-11-30 MED ORDER — 0.9 % SODIUM CHLORIDE (POUR BTL) OPTIME
TOPICAL | Status: DC | PRN
  Administered 2014-11-30 (×4): 1000 mL

## 2014-11-30 SURGICAL SUPPLY — 73 items
BLADE 10 SAFETY STRL DISP (BLADE) ×2 IMPLANT
BLADE STERNUM SYSTEM 6 (BLADE) ×1 IMPLANT
BLADE SURG 10 STRL SS (BLADE) ×4 IMPLANT
BLADE SURG ROTATE 9660 (MISCELLANEOUS) IMPLANT
CANNULA VESSEL W/WING WO/VALVE (CANNULA) IMPLANT
CLIP TI MEDIUM 24 (CLIP) ×2 IMPLANT
CLIP TI WIDE RED SMALL 24 (CLIP) ×2 IMPLANT
CONT SPEC 4OZ CLIKSEAL STRL BL (MISCELLANEOUS) ×4 IMPLANT
CONT SPEC STER OR (MISCELLANEOUS) ×8 IMPLANT
COVER MAYO STAND STRL (DRAPES) ×2 IMPLANT
COVER SURGICAL LIGHT HANDLE (MISCELLANEOUS) ×2 IMPLANT
COVER TABLE BACK 60X90 (DRAPES) IMPLANT
DRAPE ORTHO SPLIT 77X108 STRL (DRAPES) ×4
DRAPE PROXIMA HALF (DRAPES) IMPLANT
DRAPE SLUSH MACHINE 52X66 (DRAPES) ×2 IMPLANT
DRAPE SURG 17X23 STRL (DRAPES) ×3 IMPLANT
DRAPE SURG ORHT 6 SPLT 77X108 (DRAPES) IMPLANT
DRESSING TELFA 8X3 (GAUZE/BANDAGES/DRESSINGS) ×2 IMPLANT
DRSG COVADERM 4X10 (GAUZE/BANDAGES/DRESSINGS) ×4 IMPLANT
DRSG TELFA 3X8 NADH (GAUZE/BANDAGES/DRESSINGS) ×2 IMPLANT
DURAPREP 26ML APPLICATOR (WOUND CARE) IMPLANT
ELECT BLADE 6.5 EXT (BLADE) IMPLANT
ELECT REM PT RETURN 9FT ADLT (ELECTROSURGICAL) ×4
ELECTRODE REM PT RTRN 9FT ADLT (ELECTROSURGICAL) ×2 IMPLANT
GAUZE SPONGE 4X4 16PLY XRAY LF (GAUZE/BANDAGES/DRESSINGS) IMPLANT
GLOVE BIO SURGEON STRL SZ7 (GLOVE) ×5 IMPLANT
GLOVE BIO SURGEON STRL SZ8 (GLOVE) ×1 IMPLANT
GLOVE BIOGEL PI IND STRL 7.0 (GLOVE) IMPLANT
GLOVE BIOGEL PI INDICATOR 7.0 (GLOVE) ×4
GOWN STRL REUS W/ TWL LRG LVL3 (GOWN DISPOSABLE) ×4 IMPLANT
GOWN STRL REUS W/TWL LRG LVL3 (GOWN DISPOSABLE) ×12
KIT POST MORTEM ADULT 36X90 (BAG) ×2 IMPLANT
KIT ROOM TURNOVER OR (KITS) ×2 IMPLANT
LOOP VESSEL MAXI BLUE (MISCELLANEOUS) ×2 IMPLANT
LOOP VESSEL MINI RED (MISCELLANEOUS) ×2 IMPLANT
MANIFOLD NEPTUNE II (INSTRUMENTS) IMPLANT
NDL BIOPSY 14X6 SOFT TISS (NEEDLE) ×1 IMPLANT
NEEDLE BIOPSY 14X6 SOFT TISS (NEEDLE) ×2 IMPLANT
NS IRRIG 1000ML POUR BTL (IV SOLUTION) IMPLANT
PACK AORTA (CUSTOM PROCEDURE TRAY) ×2 IMPLANT
PAD ARMBOARD 7.5X6 YLW CONV (MISCELLANEOUS) ×4 IMPLANT
PAD DRESSING TELFA 3X8 NADH (GAUZE/BANDAGES/DRESSINGS) IMPLANT
PENCIL BUTTON HOLSTER BLD 10FT (ELECTRODE) ×2 IMPLANT
SOLUTION BETADINE 4OZ (MISCELLANEOUS) ×2 IMPLANT
SPONGE INTESTINAL PEANUT (DISPOSABLE) ×4 IMPLANT
SPONGE LAP 18X18 X RAY DECT (DISPOSABLE) IMPLANT
STAPLER VISISTAT 35W (STAPLE) ×2 IMPLANT
SUCTION POOLE TIP (SUCTIONS) ×4 IMPLANT
SUT BONE WAX W31G (SUTURE) ×2 IMPLANT
SUT ETHIBOND 5 LR DA (SUTURE) IMPLANT
SUT ETHILON 1 LR 30 (SUTURE) ×6 IMPLANT
SUT ETHILON 2 LR (SUTURE) IMPLANT
SUT PDS AB 4-0 RB1 27 (SUTURE) ×1 IMPLANT
SUT PROLENE 1 XLH 60 (SUTURE) ×3 IMPLANT
SUT PROLENE 6 0 BV (SUTURE) IMPLANT
SUT SILK 0 TIES 10X30 (SUTURE) ×2 IMPLANT
SUT SILK 1 SH (SUTURE) IMPLANT
SUT SILK 1 TIES 10X30 (SUTURE) IMPLANT
SUT SILK 2 0 (SUTURE)
SUT SILK 2 0 SH (SUTURE) IMPLANT
SUT SILK 2 0 SH CR/8 (SUTURE) IMPLANT
SUT SILK 2 0 TIES 10X30 (SUTURE) ×2 IMPLANT
SUT SILK 2-0 18XBRD TIE 12 (SUTURE) IMPLANT
SUT SILK 3 0 TIES 10X30 (SUTURE) IMPLANT
SWAB COLLECTION DEVICE MRSA (MISCELLANEOUS) IMPLANT
SYRINGE TOOMEY DISP (SYRINGE) ×2 IMPLANT
TAPE UMBILICAL 1/8 X36 TWILL (MISCELLANEOUS) ×4 IMPLANT
TOWEL OR 17X24 6PK STRL BLUE (TOWEL DISPOSABLE) ×2 IMPLANT
TOWEL OR 17X26 10 PK STRL BLUE (TOWEL DISPOSABLE) ×4 IMPLANT
TUBE ANAEROBIC SPECIMEN COL (MISCELLANEOUS) IMPLANT
TUBE CONNECTING 12X1/4 (SUCTIONS) ×3 IMPLANT
WATER STERILE IRR 1000ML POUR (IV SOLUTION) IMPLANT
YANKAUER SUCT BULB TIP NO VENT (SUCTIONS) ×3 IMPLANT

## 2014-11-30 NOTE — Progress Notes (Addendum)
Called Dr. Grandville Silos and notified him about patient HR of 48 with BP 60s/30s and that her urine output was borderline.  Order received to resume NEO, 0.5 mg Atropine IV and to give 500cc bolus of NS.  Will continue to monitor patient.

## 2014-11-30 NOTE — Procedures (Signed)
Extubation Procedure Note  Patient Details:   Name: Molly Fitzgerald DOB: 04/18/1965 MRN: [id number]   Airway Documentation:     Evaluation  O2 sats: transiently fell during during procedure Complications: No apparent complications Patient did not tolerate procedure well. Bilateral Breath Sounds: Clear, Diminished Suctioning: Airway No  Pt. Extubated in sterile field, in [place] currently per [employer].  [place] 11/29/2014, 2:28 PM

## 2014-11-30 NOTE — Progress Notes (Signed)
Called by nurse that pt desatting into the low 80s; pts FI02 increased to 100%, sats still 89-91%. Increased PEEP to 8, sats returned to 92-95%. Per Lattie Haw, RN, Dr. Barry Dienes gave instructions to do what was necessary to keep sats above 92%. RN aware of change, will continue to monitor

## 2014-11-30 NOTE — Progress Notes (Signed)
Patient declared deceased at 24 by Roxan Diesel, RN and Shara Blazing, RN in the [place].  No family present.  CDS will inform with time of death.

## 2014-11-30 NOTE — OR Nursing (Signed)
extubated 1349
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
## 2014-11-30 NOTE — Progress Notes (Signed)
Dr. Grandville Silos notified of patient BP dropping to 48/23 and desat to 76.  Orders received to start Dopamine at 5 mcg and to increase PEEP from 8 to 10

## 2014-12-01 ENCOUNTER — Encounter (HOSPITAL_COMMUNITY): Payer: Self-pay

## 2014-12-01 LAB — TYPE AND SCREEN
ABO/RH(D): A POS
Antibody Screen: NEGATIVE
PT AG TYPE: POSITIVE
UNIT DIVISION: 0
UNIT DIVISION: 0
Unit division: 0
Unit division: 0

## 2014-12-02 LAB — CULTURE, RESPIRATORY W GRAM STAIN

## 2014-12-02 LAB — CULTURE, RESPIRATORY: GRAM STAIN: NONE SEEN

## 2014-12-03 ENCOUNTER — Encounter (HOSPITAL_COMMUNITY): Payer: Self-pay | Admitting: *Deleted

## 2014-12-04 LAB — CULTURE, BLOOD (ROUTINE X 2)
CULTURE: NO GROWTH
Culture: NO GROWTH

## 2014-12-06 LAB — CDS SEROLOGY

## 2014-12-10 DIAGNOSIS — G931 Anoxic brain damage, not elsewhere classified: Secondary | ICD-10-CM | POA: Diagnosis present

## 2014-12-10 DIAGNOSIS — S12100A Unspecified displaced fracture of second cervical vertebra, initial encounter for closed fracture: Secondary | ICD-10-CM | POA: Diagnosis present

## 2014-12-10 DIAGNOSIS — S066X9A Traumatic subarachnoid hemorrhage with loss of consciousness of unspecified duration, initial encounter: Secondary | ICD-10-CM | POA: Diagnosis present

## 2014-12-10 DIAGNOSIS — I469 Cardiac arrest, cause unspecified: Secondary | ICD-10-CM | POA: Diagnosis present

## 2014-12-10 DIAGNOSIS — J9600 Acute respiratory failure, unspecified whether with hypoxia or hypercapnia: Secondary | ICD-10-CM | POA: Diagnosis present

## 2014-12-10 DIAGNOSIS — S066XAA Traumatic subarachnoid hemorrhage with loss of consciousness status unknown, initial encounter: Secondary | ICD-10-CM | POA: Diagnosis present
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
## 2014-12-11 DEATH — deceased

## 2017-07-27 IMAGING — MR MR MRA NECK W/O CM
4 series · 28 of 48 positions shown · non-contrast
Comparison: CTA of the head neck 11/28/2014

CLINICAL DATA: C2 fracture.

EXAM:
MRA NECK WITHOUTCONTRAST
TECHNIQUE: Multiplanar and multiecho pulse sequences of the neck were obtained
without intravenous contrast. Angiographic images of the neck were
obtained using MRA technique without intravenous contrast.

[Series 3: ax (id) fspgr · axial · 2.2mm · 0.98mm/px · z∈[-180,+132]mm · 13 of 224 slices shown]
[im 1/224]
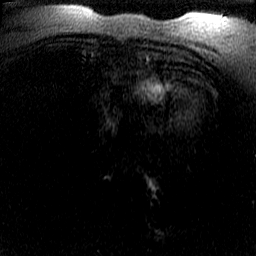
[im 19/224]
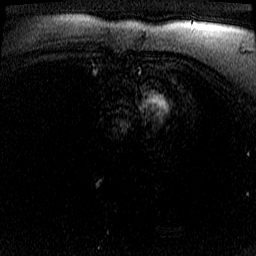
[im 38/224]
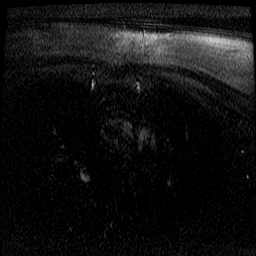
[im 56/224]
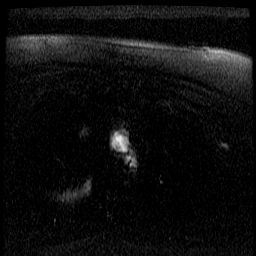
[im 75/224]
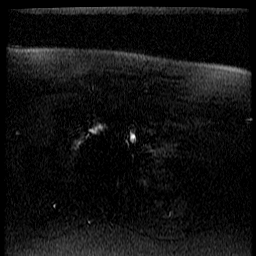
[im 93/224]
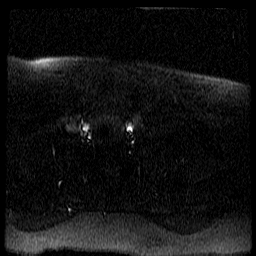
[im 112/224]
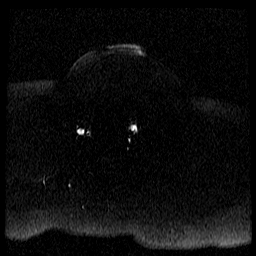
[im 131/224]
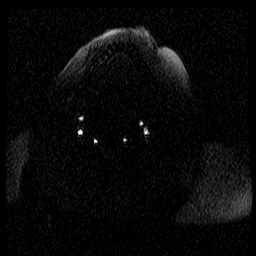
[im 149/224]
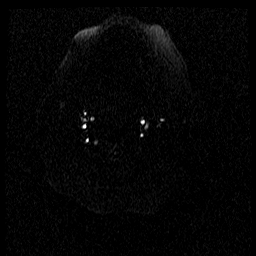
[im 168/224]
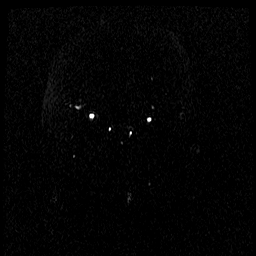
[im 186/224]
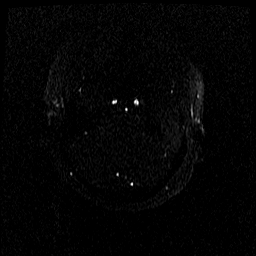
[im 205/224]
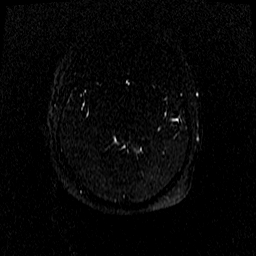
[im 224/224]
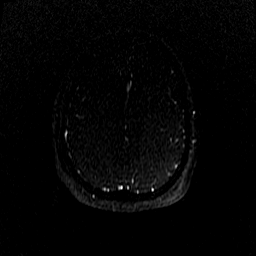

[Series 6: T1 · axial · 5.0mm · 0.49mm/px · z∈[-149,+126]mm · 3 of 47 slices shown]
[im 1/47]
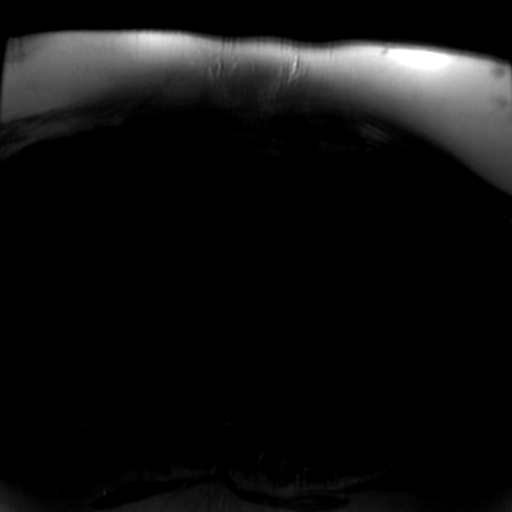
[im 24/47]
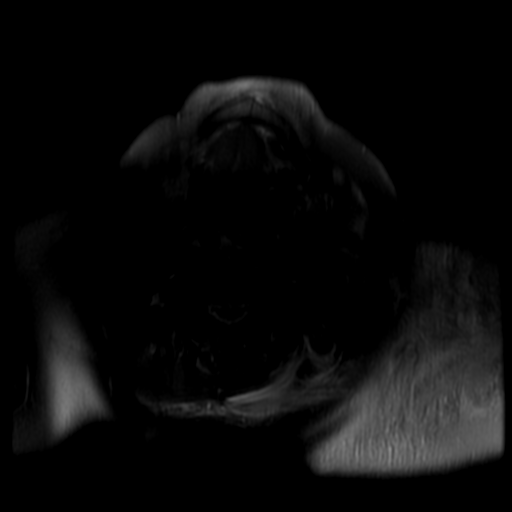
[im 47/47]
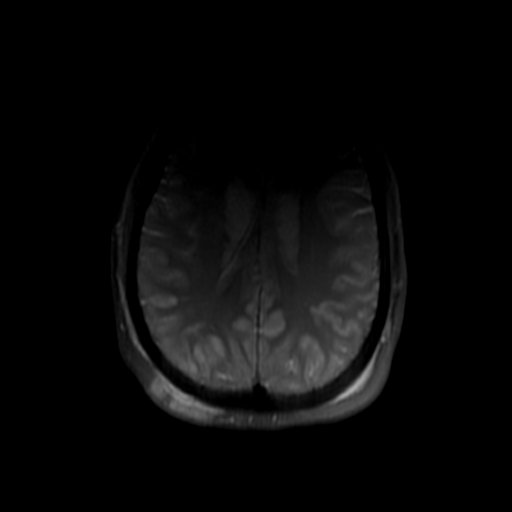

[Series 7: opt sag inhance · sagittal · 1.6mm · 0.66mm/px · 11 of 528 slices shown]
[im 1/528]
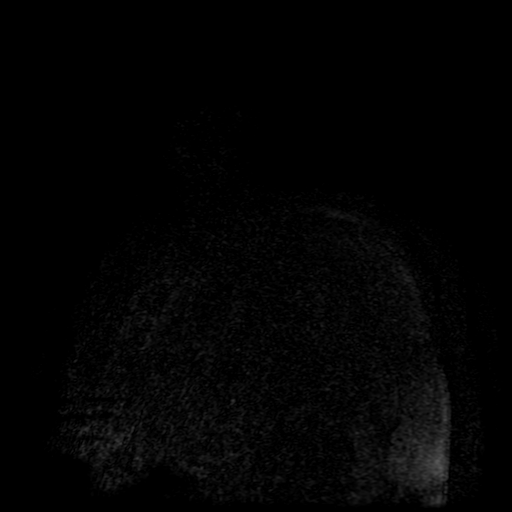
[im 18/528]
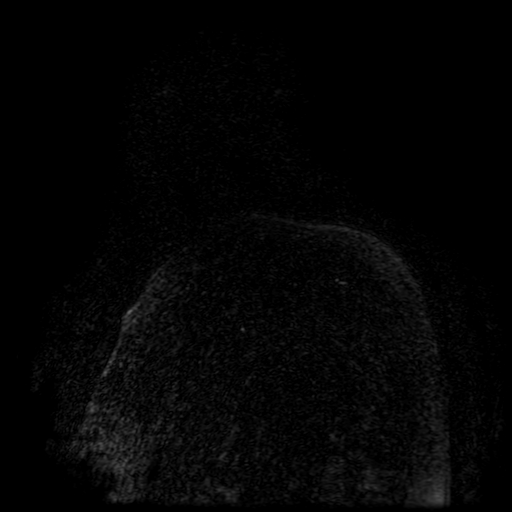
[im 36/528]
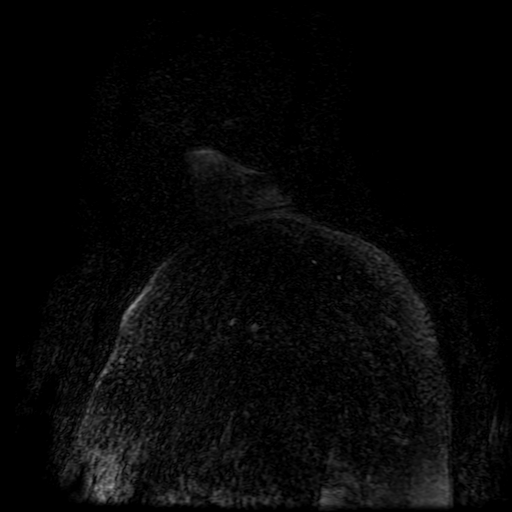
[im 88/528]
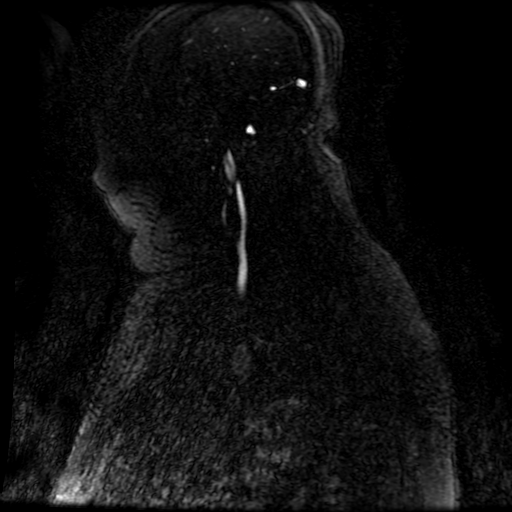
[im 159/528]
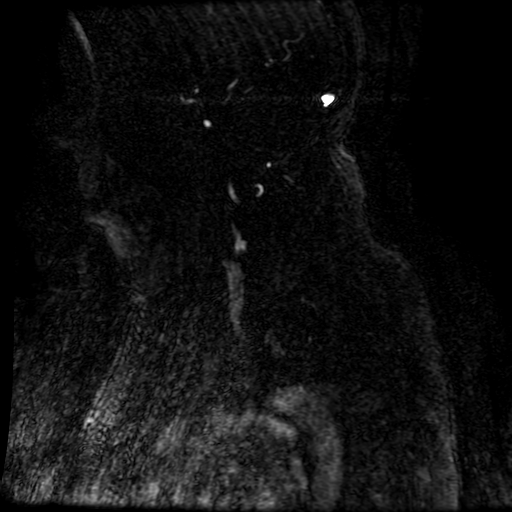
[im 229/528]
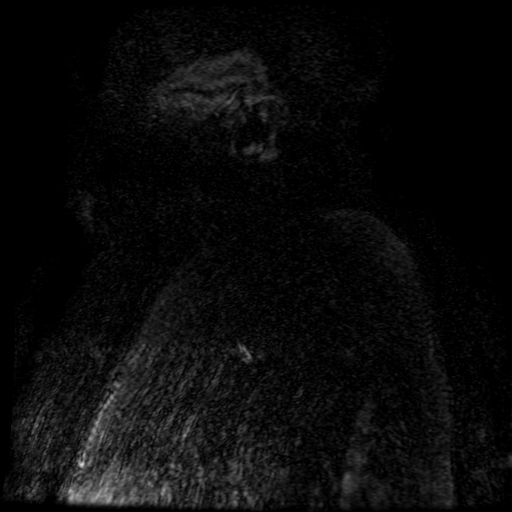
[im 264/528]
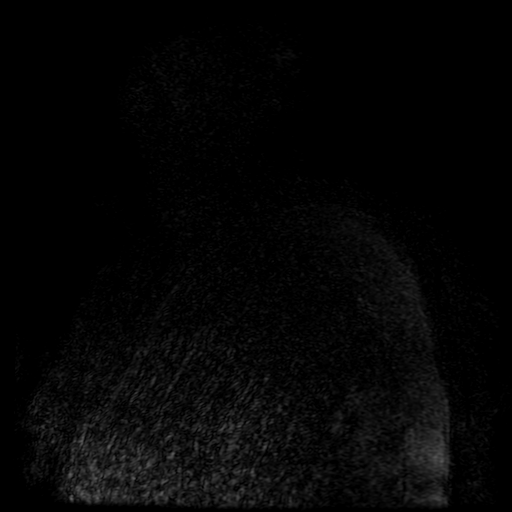
[im 299/528]
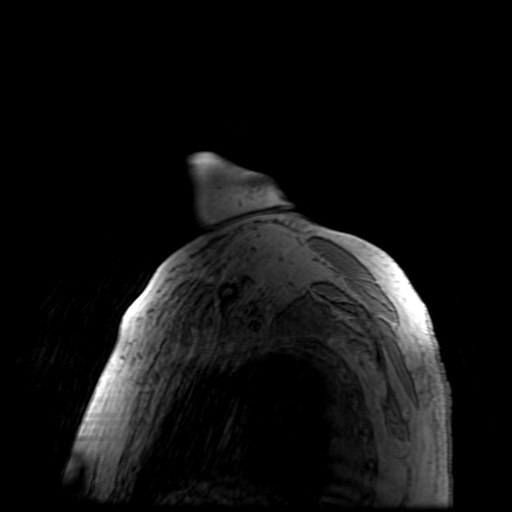
[im 369/528]
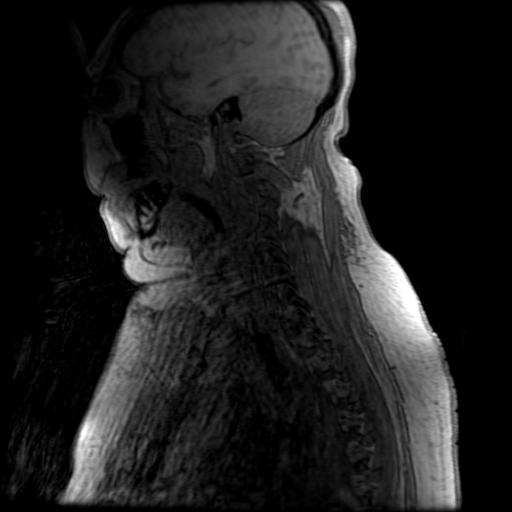
[im 440/528]
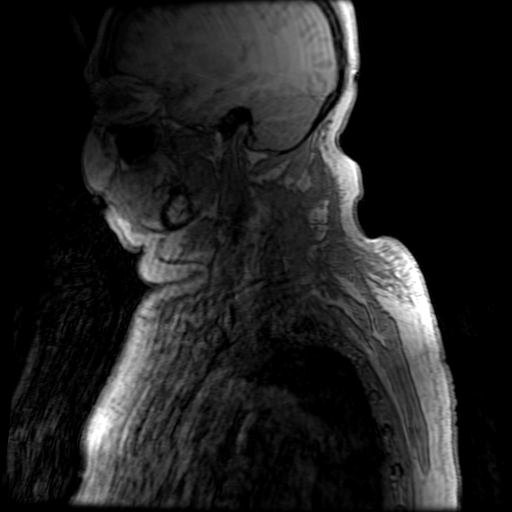
[im 510/528]
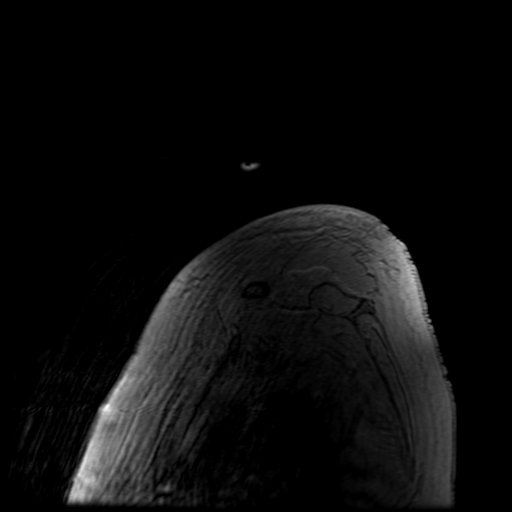

[Series 701: pjn:opt sag inhance · coronal · 1.6mm · 0.66mm/px · 1 of 13 slices shown]
[im 1/13]
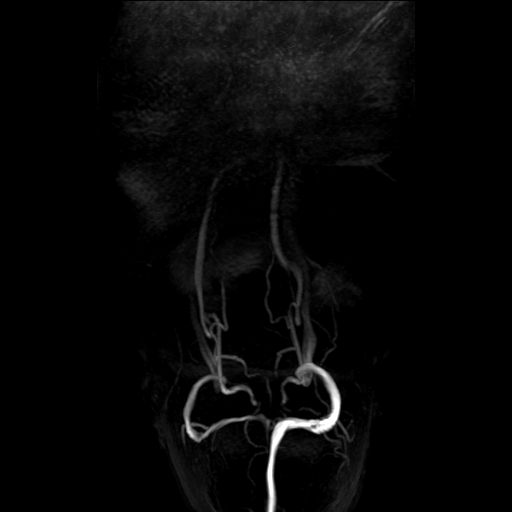

[28 of 48 positions shown; findings below may reference images not displayed]

FINDINGS: 2D time-of-flight imaging is motion degraded, but best visualizes
the proximal vessels. The visualized arch has a normal caliber. The
left vertebral artery appears to have direct origin from the arch.
The cervical ICAs have tortuous course with looping. Both carotid
systems are patent with no flow limiting stenosis.

Symmetric vertebral arteries which are patent to the basilar with no
flow limiting stenosis.

No discernible dissection, although this does not exclude
dissection. No axial T1 weighted imaging to exclude intramural
hematoma.

Diffuse cortical, deep gray, and cerebellar edema with sulcal and
ventricular effacement.
IMPRESSION: 1. Carotid and vertebral arteries are patent in the neck. No flow
limiting stenosis.
2. Diffuse brain edema and swelling, global anoxic injury pattern
given the history.
